# Patient Record
Sex: Male | Born: 1935
Health system: Southern US, Community
[De-identification: ages and names within clinical notes are randomized; demographics above are authoritative.]

## PROBLEM LIST (undated history)

## (undated) HISTORY — PX: TONSILLECTOMY: SUR1361

---

## 2014-09-15 HISTORY — PX: CATARACT EXTRACTION, BILATERAL: SHX1313

## 2016-04-08 DIAGNOSIS — M9903 Segmental and somatic dysfunction of lumbar region: Secondary | ICD-10-CM | POA: Diagnosis not present

## 2016-04-08 DIAGNOSIS — M4726 Other spondylosis with radiculopathy, lumbar region: Secondary | ICD-10-CM | POA: Diagnosis not present

## 2016-04-09 DIAGNOSIS — M9903 Segmental and somatic dysfunction of lumbar region: Secondary | ICD-10-CM | POA: Diagnosis not present

## 2016-04-09 DIAGNOSIS — M4726 Other spondylosis with radiculopathy, lumbar region: Secondary | ICD-10-CM | POA: Diagnosis not present

## 2016-04-14 DIAGNOSIS — M4726 Other spondylosis with radiculopathy, lumbar region: Secondary | ICD-10-CM | POA: Diagnosis not present

## 2016-04-14 DIAGNOSIS — M9903 Segmental and somatic dysfunction of lumbar region: Secondary | ICD-10-CM | POA: Diagnosis not present

## 2016-04-15 DIAGNOSIS — M4726 Other spondylosis with radiculopathy, lumbar region: Secondary | ICD-10-CM | POA: Diagnosis not present

## 2016-04-15 DIAGNOSIS — M9903 Segmental and somatic dysfunction of lumbar region: Secondary | ICD-10-CM | POA: Diagnosis not present

## 2016-04-15 DIAGNOSIS — H1013 Acute atopic conjunctivitis, bilateral: Secondary | ICD-10-CM | POA: Diagnosis not present

## 2016-04-16 DIAGNOSIS — M9903 Segmental and somatic dysfunction of lumbar region: Secondary | ICD-10-CM | POA: Diagnosis not present

## 2016-04-16 DIAGNOSIS — M4726 Other spondylosis with radiculopathy, lumbar region: Secondary | ICD-10-CM | POA: Diagnosis not present

## 2016-04-17 DIAGNOSIS — M4726 Other spondylosis with radiculopathy, lumbar region: Secondary | ICD-10-CM | POA: Diagnosis not present

## 2016-04-17 DIAGNOSIS — M9903 Segmental and somatic dysfunction of lumbar region: Secondary | ICD-10-CM | POA: Diagnosis not present

## 2016-04-21 DIAGNOSIS — M4726 Other spondylosis with radiculopathy, lumbar region: Secondary | ICD-10-CM | POA: Diagnosis not present

## 2016-04-21 DIAGNOSIS — M9903 Segmental and somatic dysfunction of lumbar region: Secondary | ICD-10-CM | POA: Diagnosis not present

## 2016-04-22 DIAGNOSIS — M9903 Segmental and somatic dysfunction of lumbar region: Secondary | ICD-10-CM | POA: Diagnosis not present

## 2016-04-22 DIAGNOSIS — M4726 Other spondylosis with radiculopathy, lumbar region: Secondary | ICD-10-CM | POA: Diagnosis not present

## 2016-04-23 DIAGNOSIS — M9903 Segmental and somatic dysfunction of lumbar region: Secondary | ICD-10-CM | POA: Diagnosis not present

## 2016-04-23 DIAGNOSIS — M4726 Other spondylosis with radiculopathy, lumbar region: Secondary | ICD-10-CM | POA: Diagnosis not present

## 2016-04-24 DIAGNOSIS — M9903 Segmental and somatic dysfunction of lumbar region: Secondary | ICD-10-CM | POA: Diagnosis not present

## 2016-04-24 DIAGNOSIS — M4726 Other spondylosis with radiculopathy, lumbar region: Secondary | ICD-10-CM | POA: Diagnosis not present

## 2016-04-28 DIAGNOSIS — M9903 Segmental and somatic dysfunction of lumbar region: Secondary | ICD-10-CM | POA: Diagnosis not present

## 2016-04-28 DIAGNOSIS — M4726 Other spondylosis with radiculopathy, lumbar region: Secondary | ICD-10-CM | POA: Diagnosis not present

## 2016-04-30 DIAGNOSIS — M4726 Other spondylosis with radiculopathy, lumbar region: Secondary | ICD-10-CM | POA: Diagnosis not present

## 2016-04-30 DIAGNOSIS — M9903 Segmental and somatic dysfunction of lumbar region: Secondary | ICD-10-CM | POA: Diagnosis not present

## 2016-05-01 DIAGNOSIS — M9903 Segmental and somatic dysfunction of lumbar region: Secondary | ICD-10-CM | POA: Diagnosis not present

## 2016-05-01 DIAGNOSIS — M4726 Other spondylosis with radiculopathy, lumbar region: Secondary | ICD-10-CM | POA: Diagnosis not present

## 2016-05-05 DIAGNOSIS — M9903 Segmental and somatic dysfunction of lumbar region: Secondary | ICD-10-CM | POA: Diagnosis not present

## 2016-05-05 DIAGNOSIS — M4726 Other spondylosis with radiculopathy, lumbar region: Secondary | ICD-10-CM | POA: Diagnosis not present

## 2016-05-06 DIAGNOSIS — M4726 Other spondylosis with radiculopathy, lumbar region: Secondary | ICD-10-CM | POA: Diagnosis not present

## 2016-05-06 DIAGNOSIS — M9903 Segmental and somatic dysfunction of lumbar region: Secondary | ICD-10-CM | POA: Diagnosis not present

## 2016-05-13 DIAGNOSIS — M4726 Other spondylosis with radiculopathy, lumbar region: Secondary | ICD-10-CM | POA: Diagnosis not present

## 2016-05-13 DIAGNOSIS — M9903 Segmental and somatic dysfunction of lumbar region: Secondary | ICD-10-CM | POA: Diagnosis not present

## 2016-06-09 DIAGNOSIS — Z23 Encounter for immunization: Secondary | ICD-10-CM | POA: Diagnosis not present

## 2016-09-17 DIAGNOSIS — H43391 Other vitreous opacities, right eye: Secondary | ICD-10-CM | POA: Diagnosis not present

## 2016-09-24 DIAGNOSIS — H43391 Other vitreous opacities, right eye: Secondary | ICD-10-CM | POA: Diagnosis not present

## 2016-10-23 DIAGNOSIS — H43391 Other vitreous opacities, right eye: Secondary | ICD-10-CM | POA: Diagnosis not present

## 2017-04-30 DIAGNOSIS — H1013 Acute atopic conjunctivitis, bilateral: Secondary | ICD-10-CM | POA: Diagnosis not present

## 2017-05-20 ENCOUNTER — Other Ambulatory Visit: Payer: Self-pay | Admitting: Family Medicine

## 2017-05-20 DIAGNOSIS — R42 Dizziness and giddiness: Secondary | ICD-10-CM

## 2017-05-20 DIAGNOSIS — G501 Atypical facial pain: Secondary | ICD-10-CM | POA: Diagnosis not present

## 2017-05-20 DIAGNOSIS — R51 Headache: Principal | ICD-10-CM

## 2017-05-20 DIAGNOSIS — R519 Headache, unspecified: Secondary | ICD-10-CM

## 2017-05-29 ENCOUNTER — Other Ambulatory Visit: Payer: Self-pay

## 2017-06-04 ENCOUNTER — Ambulatory Visit
Admission: RE | Admit: 2017-06-04 | Discharge: 2017-06-04 | Disposition: A | Discharge status: Home / Self Care | Payer: Medicare Other | Source: Ambulatory Visit | Attending: Family Medicine | Admitting: Family Medicine

## 2017-06-04 DIAGNOSIS — R51 Headache: Principal | ICD-10-CM

## 2017-06-04 DIAGNOSIS — R42 Dizziness and giddiness: Secondary | ICD-10-CM

## 2017-06-04 DIAGNOSIS — R519 Headache, unspecified: Secondary | ICD-10-CM

## 2017-06-20 ENCOUNTER — Ambulatory Visit (INDEPENDENT_AMBULATORY_CARE_PROVIDER_SITE_OTHER): Payer: Medicare Other | Admitting: Physician Assistant

## 2017-06-20 ENCOUNTER — Encounter: Payer: Self-pay | Admitting: Physician Assistant

## 2017-06-20 VITALS — BP 102/68 | HR 113 | Temp 97.6°F | Resp 16 | Ht 73.23 in | Wt 174.0 lb

## 2017-06-20 DIAGNOSIS — Z23 Encounter for immunization: Secondary | ICD-10-CM | POA: Diagnosis not present

## 2017-06-20 DIAGNOSIS — G4489 Other headache syndrome: Secondary | ICD-10-CM | POA: Diagnosis not present

## 2017-06-20 MED ORDER — FLUTICASONE PROPIONATE 50 MCG/ACT NA SUSP: 2.0000 | Freq: Every day | NASAL | 12 refills | Status: DC

## 2017-06-20 NOTE — Progress Notes (Signed)
Subjective:    Patient ID: Ryan Reynolds, male    DOB: 09-08-1936, 81 y.o.   MRN: [id number]  HPI Patient presents today as a new patient to establish care. His original PCP was Dr. Leilani Able at [facility] and last saw her on 05/20/17. A brain MRI was ordered per Dr. Pecola Leisure due to his severe LEFT sided headaches. He tried to contact the office multiple times for results of the scan that was done 06/04/2017 and was not able to get a hold of them until this past Tuesday. He was told the scan was normal. He felt that he was not getting the care he wanted so he would like to switch primary care provider.   Patient reports he gets severe 10/10 sharp headaches lasting about 1 hour that occurs in the evenings. They start as a "burning sensation in the left lateral nostril that travels up to the LEFT temple". He denies sensitivity to light or sound with the headaches. The headache causes him loss of appetite and notes he has lost about 9lb since his last weigh in at Dr. Haskel Schroeder office last month. He cannot blow nose when he has a headache due to severity of pain.  The day after the headaches, he feels "washed out and exhausted." The periorbital area of the LEFT eye is sensitive after the headaches. He last saw his optometrist 04/2017 and there were no changes.  He notes they started about 9 weeks ago. He is unable to identify any triggers for the headaches but reports he had two crowns implanted of front teeth around the same time of the onset of headaches. He was prescribed Tramadol #30 per Dr. Pecola Leisure, he took 15 tablets but discontinued as he became constipated and did not notice any difference in pain relief between it and Aleve. He will take 1 tablet of Aleve when the headaches start and helps resolve it "as far as he can tell".   Patient has not had the headaches in the last 3 nights, he is unsure why and has not done anything different.   Reports BM every other day, on Miralax. Reports  constipated after starting Tramadol, was regular before.  Review of Systems  Constitutional: Positive for appetite change, fatigue and unexpected weight change (9lb loss in 1 month). Negative for chills, diaphoresis and fever.  HENT: Positive for congestion (LEFT), postnasal drip (LEFT), rhinorrhea, sinus pain and sinus pressure. Negative for ear pain, sneezing, sore throat, tinnitus and trouble swallowing.   Eyes: Negative for visual disturbance.  Respiratory: Positive for cough. Negative for shortness of breath and wheezing.   Cardiovascular: Negative for chest pain and palpitations.  Gastrointestinal: Negative for abdominal pain, blood in stool, diarrhea, nausea and vomiting.  Genitourinary: Negative for difficulty urinating.  Neurological: Positive for headaches. Negative for dizziness, tremors, syncope, speech difficulty and weakness.  Psychiatric/Behavioral: Negative for dysphoric mood. The patient is not nervous/anxious.    There are no active problems to display for this patient.  Prior to Admission medications   Not on File   No Known Allergies Social History   Social History  . Marital status: Married    Spouse name: Harriett Sine   . Number of children: 1  . Years of education: 2.5 year undergrad   Occupational History  . 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 Smoking status: Current Every Day Smoker    Packs/day: 1.00  Types: Cigarettes  . Smokeless tobacco: Never Used  . Alcohol use No  . Drug use: No  . Sexual activity: Not on file   Other Topics Concern  . Not on file   Social History Narrative   Lives at home with wife. No steps to front porch of 1 story home. 1 step to back porch.       Objective:   Physical Exam  Constitutional: He is oriented to person, place, and time. He appears well-developed and well-nourished. No distress.  HENT:  Head: Normocephalic and atraumatic.  Right Ear: External ear  normal.  Left Ear: External ear normal.  Nose: Nose normal.  Mouth/Throat: Oropharynx is clear and moist. No oropharyngeal exudate.  No tenderness to palpation over the temporal arteries  Eyes: Pupils are equal, round, and reactive to light. Conjunctivae are normal. Right eye exhibits no discharge. Left eye exhibits no discharge. No scleral icterus.  Neck: Neck supple. No JVD present. Carotid bruit is not present. No tracheal deviation present. No thyromegaly present.  Cardiovascular: Normal rate, regular rhythm and intact distal pulses.   Murmur (2/6 systolic murmur heard over the aorta) heard. Pulmonary/Chest: Effort normal and breath sounds normal. No stridor. No respiratory distress. He has no wheezes. He has no rales. He exhibits no tenderness.  Lymphadenopathy:    He has no cervical adenopathy.  Neurological: He is alert and oriented to person, place, and time.  Skin: Skin is warm and dry. No rash noted. He is not diaphoretic. No erythema. No pallor.  Psychiatric: He has a normal mood and affect. His behavior is normal. Judgment and thought content normal.      Assessment & Plan:  1. Other headache syndrome - Try IN steroid spray to see if it helps with headaches.  - fluticasone (FLONASE) 50 MCG/ACT nasal spray; Place 2 sprays into both nostrils daily.  Dispense: 16 g; Refill: 12  2. Need for prophylactic vaccination and inoculation against influenza - Complete in office today.  - Flu Vaccine QUAD 36+ mos IM  Follow up in 2 weeks to re-evaluate headaches and efficacy of Flonase for symptoms, sooner if needed.  Respectfully, Gala Romney PA-S 2019

## 2017-06-20 NOTE — Patient Instructions (Signed)
     IF you received an x-ray today, you will receive an invoice from [facility]. Please contact [facility] at [phone] with questions or concerns regarding your invoice.   IF you received labwork today, you will receive an invoice from [facility]. Please contact [facility] at [phone] with questions or concerns regarding your invoice.   Our billing staff will not be able to assist you with questions regarding bills from these companies.  You will be contacted with the lab results as soon as they are available. The fastest way to get your results is to activate your My Chart account. Instructions are located on the last page of this paperwork. If you have not heard from us regarding the results in 2 weeks, please contact this office.     

## 2017-06-23 ENCOUNTER — Telehealth: Payer: Self-pay

## 2017-06-25 NOTE — Progress Notes (Signed)
Patient ID: Ryan Reynolds, male     DOB: [birth year], 81 y.o.    MRN: [id number]  PCP: Leilani Able, MD  Chief Complaint  Patient presents with  . Establish Care    pt main concern is headaches that started 9 weeks ago. Pt states they happen at night snd more on the left side. Pt states he feels that are affecting his appite.     Subjective:   This patient is new to this practice and presents to establish care and for evaluation of headaches. He is accompanied by his wife. A friend at church, Allayne Butcher, who is also my patient, recommended me to him.  He was last seen by his PCP on 9/05 for these headaches, which began about 9 weeks ago. No identified triggers, though he had some dental work around the time the headaches began. The headaches typically occur in the evenings, are 10/10 and the pain is sharp. They begin with a burning sensation in the LEFT nostril that radiates up into the LEFT temple, and last about an hour. The pain is too severe to blow his nose when he has the headache. There is no associated photo- or phonophobia, but he does experience anorexia and reports an estimated 9 lb weight loss since his visit on 05/20/2017.  He was prescribed tramadol and a brain MRI was performed on 06/04/2017. It took until 10/02 to get the results, reportedly normal, which is why he is looking to establish care with me.  The tramadol causes constipation and was not more effective than naproxen, so he stopped it. Naproxen helps some if he takes it as soon as the headache starts.  The day following one of these headaches, he is very tired, "washed out and exhausted."  His last eye exam was 04/2017 and was reportedly normal.  Review of Systems Constitutional: Positive for appetite change, fatigue and unexpected weight change (9lb loss in 1 month). Negative for chills, diaphoresis and fever.  HENT: Positive for congestion (LEFT), postnasal drip (LEFT), rhinorrhea, sinus pain and sinus pressure.  Negative for ear pain, sneezing, sore throat, tinnitus and trouble swallowing.   Eyes: Negative for visual disturbance.  Respiratory: Positive for cough. Negative for shortness of breath and wheezing.   Cardiovascular: Negative for chest pain and palpitations.  Gastrointestinal: Negative for abdominal pain, blood in stool, diarrhea, nausea and vomiting.  Genitourinary: Negative for difficulty urinating.  Neurological: Positive for headaches. Negative for dizziness, tremors, syncope, speech difficulty and weakness.  Psychiatric/Behavioral: Negative for dysphoric mood. The patient is not nervous/anxious.     Prior to Admission medications   None on file   No Known Allergies   Patient Active Problem List   Diagnosis Date Noted  . Other headache syndrome 06/20/2017     Family History  Problem Relation Age of Onset  . Stroke Mother   . Stroke Father      Social History   Social History  . Marital status: Married    Spouse name: Harriett Sine   . Number of children: 1  . Years of education: 2.5 year undergrad   Occupational History  . 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 Smoking status: Current Every Day Smoker    Packs/day: 1.00    Types: Cigarettes  . Smokeless tobacco: Never Used  . Alcohol use No  . Drug use: No  . Sexual activity: Not on file  Other Topics Concern  . Not on file   Social History Narrative   Lives at home with wife. No steps to front porch of 1 story home. 1 step to back porch.          Objective:  Physical Exam  Constitutional: He is oriented to person, place, and time. He appears well-developed and well-nourished. He is active and cooperative. No distress.  BP 102/68   Pulse (!) 113   Temp 97.6 F (36.4 C) (Oral)   Resp 16   Ht 6' 1.23" (1.86 m)   Wt 174 lb (78.9 kg)   SpO2 95%   BMI 22.81 kg/m   HENT:  Head: Normocephalic and atraumatic.  Right Ear: Hearing normal.  Left Ear:  Hearing normal.  Nose: Mucosal edema present. No rhinorrhea, nose lacerations, sinus tenderness, nasal deformity, septal deviation or nasal septal hematoma. No epistaxis.  No foreign bodies. Right sinus exhibits no maxillary sinus tenderness and no frontal sinus tenderness. Left sinus exhibits no maxillary sinus tenderness and no frontal sinus tenderness.  No temporal tenderness  Eyes: Conjunctivae are normal. No scleral icterus.  Neck: Normal range of motion. Neck supple. No thyromegaly present.  Cardiovascular: Normal rate and regular rhythm.   Murmur (in the aortic space) heard.  Systolic murmur is present with a grade of 2/6  Pulses:      Radial pulses are 2+ on the right side, and 2+ on the left side.  Pulmonary/Chest: Effort normal and breath sounds normal.  Lymphadenopathy:       Head (right side): No tonsillar, no preauricular, no posterior auricular and no occipital adenopathy present.       Head (left side): No tonsillar, no preauricular, no posterior auricular and no occipital adenopathy present.    He has no cervical adenopathy.       Right: No supraclavicular adenopathy present.       Left: No supraclavicular adenopathy present.  Neurological: He is alert and oriented to person, place, and time. He has normal strength. No cranial nerve deficit or sensory deficit. GCS eye subscore is 4. GCS verbal subscore is 5. GCS motor subscore is 6.  Reflex Scores:      Bicep reflexes are 2+ on the right side and 2+ on the left side.      Patellar reflexes are 2+ on the right side and 2+ on the left side. Skin: Skin is warm, dry and intact. No rash noted. No cyanosis or erythema. Nails show no clubbing.  Psychiatric: He has a normal mood and affect. His speech is normal and behavior is normal.   Mr Brain Wo Contrast  Result Date: 06/04/2017 CLINICAL DATA:  Left-sided headache at night EXAM: MRI HEAD WITHOUT CONTRAST TECHNIQUE: Multiplanar, multiecho pulse sequences of the brain and  surrounding structures were obtained without intravenous contrast. COMPARISON:  None. FINDINGS: Brain: Diffusion imaging does not show any acute or subacute infarction. There is mild age related volume loss. There are a few small foci of T2 and FLAIR signal in the hemispheric white matter consistent with minimal small vessel change, less than often seen in healthy individuals of this age. No cortical or large vessel territory infarction. No evidence of mass lesion, hemorrhage, hydrocephalus or extra-axial collection. No pituitary mass. Vascular: Major vessels at the base of the brain show flow. Skull and upper cervical spine: Negative Sinuses/Orbits: Clear/normal Other: None significant IMPRESSION: No cause of headache identified. Essentially normal study for age. Mild age related volume loss and minimal small vessel  change of the hemispheric white matter. Electronically Signed   By: Paulina Fusi M.D.   On: 06/04/2017 10:23           Assessment & Plan:   Problem List Items Addressed This Visit    Other headache syndrome - Primary    Unclear etiology. MRI brain reassuring. Consider LEFT sinus as source. Trial of steroid nasal spray.      Relevant Medications   fluticasone (FLONASE) 50 MCG/ACT nasal spray    Other Visit Diagnoses    Need for prophylactic vaccination and inoculation against influenza       Relevant Orders   Flu Vaccine QUAD 36+ mos IM (Completed)       Return in about 2 weeks (around 07/04/2017) for re-evaluation of headache, sinuses.   Fernande Bras, PA-C [facility] at [facility]

## 2017-06-25 NOTE — Assessment & Plan Note (Signed)
Unclear etiology. MRI brain reassuring. Consider LEFT sinus as source. Trial of steroid nasal spray.

## 2017-07-10 ENCOUNTER — Encounter: Payer: Self-pay | Admitting: Physician Assistant

## 2017-07-10 ENCOUNTER — Ambulatory Visit (INDEPENDENT_AMBULATORY_CARE_PROVIDER_SITE_OTHER): Payer: Medicare Other | Admitting: Physician Assistant

## 2017-07-10 VITALS — BP 120/76 | HR 105 | Temp 97.6°F | Resp 18 | Ht 73.23 in | Wt 176.6 lb

## 2017-07-10 DIAGNOSIS — F17200 Nicotine dependence, unspecified, uncomplicated: Secondary | ICD-10-CM | POA: Diagnosis not present

## 2017-07-10 DIAGNOSIS — Z23 Encounter for immunization: Secondary | ICD-10-CM | POA: Diagnosis not present

## 2017-07-10 DIAGNOSIS — G4489 Other headache syndrome: Secondary | ICD-10-CM | POA: Diagnosis not present

## 2017-07-10 DIAGNOSIS — H9192 Unspecified hearing loss, left ear: Secondary | ICD-10-CM | POA: Insufficient documentation

## 2017-07-10 NOTE — Progress Notes (Signed)
Subjective:    Patient ID: Ryan Reynolds, male    DOB: 20-Jan-1936, 81 y.o.   MRN: [id number]  HPI  Mr. Mawson is a 81 year old Caucasian male with a past medical history significant for hearing loss in left ear and smoking who presents today for follow-up of headaches. Mr. Blankenbaker was last seen in clinic 06/20/17 with a chief complaint of severe left sided headaches. He was previously seen by Dr. Leilani Able at [facility] on 05/20/17 for nightly headaches that were causing him 10/10 pain. He reports it was a fierce, burning pain on the left side of his nose that would last 20 minutes - 2 hours. He was treated with Tramadol and got a brain MRI which was normal. Mr. Buckle reports the Tramadol caused constipation so he stopped taking the pills. His headaches were improving about 3-4 days prior to his visit on 06/20/17. At his last visit on 06/20/17 he was started on Flonase.    Mr. Weida reports the Aleda Grana has really helped. He believes there is a direct correlation with how much mucous comes out and an improvement in his headaches. He states he has only had 1 headache over the last 20 days that was a 4-5/10. He describes having minor discomfort on 10 of the last 20 days that he rates as a 1-2/10 on the pain scale. The other days he has had even less discomfort. Mr. Proudfoot reports he had to take Aleve 7-8 times since his last visit. Mr. Cammack endorses some nasal congestion and post-nasal drip. He reports he has also had a mild cough due to drainage. Mr. Tsuda denies fevers. He also denies vision changes and aura, but reports the other day he had some mild blurred vision in his upper left eye that seemed wavy and lasted about 5 minutes. He denies photophobia or phonophobia. He also denies eye tearing, ear pain or ear drainage. Mr. Perreira also denies sore throat, dyspnea, unsteadiness or dizziness.   Medications:  Prior to Admission medications   Medication Sig Start Date End Date Taking?  Authorizing Provider  fluticasone (FLONASE) 50 MCG/ACT nasal spray Place 2 sprays into both nostrils daily. 06/20/17  Yes Jeffery, Chelle, PA-C   Allergies: No Known Allergies  Chronic Medical Conditions:  Patient Active Problem List   Diagnosis Date Noted  . Hearing loss in left ear 07/10/2017  . Smoker 07/10/2017  . Other headache syndrome 06/20/2017   Review of Systems     Objective:   Physical Exam  Constitutional: He appears well-developed and well-nourished. He is cooperative.  BP 120/76 (BP Location: Right Arm, Patient Position: Sitting, Cuff Size: Normal)   Pulse (!) 105   Temp 97.6 F (36.4 C)   Resp 18   Ht 6' 1.23" (1.86 m)   Wt 176 lb 9.6 oz (80.1 kg)   SpO2 97%   BMI 23.15 kg/m    HENT:  Head: Normocephalic and atraumatic.  Right Ear: Hearing, external ear and ear canal normal. Tympanic membrane is not erythematous.  Left Ear: Hearing, tympanic membrane, external ear and ear canal normal. Tympanic membrane is not erythematous.  Nose: Nose normal. Right sinus exhibits no maxillary sinus tenderness and no frontal sinus tenderness. Left sinus exhibits no maxillary sinus tenderness and no frontal sinus tenderness.  Mouth/Throat: Oropharynx is clear and moist and mucous membranes are normal. No oral lesions.  Eyes: Pupils are equal, round, and reactive to light. Conjunctivae and EOM are normal.  Fundoscopic exam:  The right eye shows no AV nicking, no exudate and no papilledema.       The left eye shows no AV nicking, no exudate and no papilledema.  Neck: Normal range of motion and full passive range of motion without pain. Neck supple. No thyromegaly present.  Cardiovascular: Normal rate, regular rhythm and normal heart sounds.   No murmur heard. Pulses:      Radial pulses are 2+ on the right side, and 2+ on the left side.  Pulmonary/Chest: Effort normal and breath sounds normal.  Lymphadenopathy:    He has no cervical adenopathy.  Neurological: He is  alert. No cranial nerve deficit.  Skin: Skin is warm and dry.      Assessment & Plan:  1. Other headache syndrome - Continue Flonase 5850mcg/act 2 sprays in each nostril daily - Instructed patient to return to clinic if symptoms do not improve or worsen in the next 2-4 weeks and we will discuss referral to neurology.  2. Need for pneumococcal vaccination - Pneumococcal polysaccharide vaccine 23-valent greater than or equal to 2yo subcutaneous/IM administered today in clinic  Emanuella Nickle, PA-S

## 2017-07-10 NOTE — Progress Notes (Signed)
Patient ID: Ryan Reynolds, male    DOB: [birth year], 81 y.o.   MRN: [id number]  PCP: Ryan Mons, PA-C  Chief Complaint  Patient presents with  . Headache    Pt states headaches have been better and has only a had couple of minor headaches.  . Sinus Problem  . Follow-up    Subjective:   Presents for evaluation of headache.  I met him on 06/20/2017, accompanied by his wife, to establish care and for evaluation of 9 weeks of headache. His previous provider had ordered an MRI, which revealed no cause, and tramadol was prescribed. The tramadol caused constipation and was no more effective than naproxen, so he stopped it. The naproxen was minimally effective.  Given the associated symptoms, subacute sinusitis/allergic rhinitis was considered a potential cause of his headaches, and he was prescribed a steroid nasal spray.  Records from his previous provider were requested and received. The only vaccination documented in influenza in 2012. No other health maintenance items are included.  Today he reports that his headaches have been less frequent, having only a couple of "minor" headaches since our previous visit. He finds that there is a direct correlation between eliminating the mucous and headache reduction.    Review of Systems No CP, SOB, HA, dizziness. No fever, chills.    Patient Active Problem List   Diagnosis Date Noted  . Hearing loss in left ear 07/10/2017  . Smoker 07/10/2017  . Other headache syndrome 06/20/2017     Prior to Admission medications   Medication Sig Start Date End Date Taking? Authorizing Provider  fluticasone (FLONASE) 50 MCG/ACT nasal spray Place 2 sprays into both nostrils daily. 06/20/17  Yes Floyd Wade, PA-C     No Known Allergies     Objective:  Physical Exam  Constitutional: He is oriented to person, place, and time. He appears well-developed and well-nourished. He is active and cooperative. No distress.  BP 120/76 (BP  Location: Right Arm, Patient Position: Sitting, Cuff Size: Normal)   Pulse (!) 105   Temp 97.6 F (36.4 C)   Resp 18   Ht 6' 1.23" (1.86 m)   Wt 176 lb 9.6 oz (80.1 kg)   SpO2 97%   BMI 23.15 kg/m   HENT:  Head: Normocephalic and atraumatic.  Right Ear: Hearing normal.  Left Ear: Hearing normal.  Eyes: Conjunctivae are normal. No scleral icterus.  Neck: Normal range of motion. Neck supple. No thyromegaly present.  Cardiovascular: Normal rate, regular rhythm and normal heart sounds.  Pulses:      Radial pulses are 2+ on the right side, and 2+ on the left side.  Pulmonary/Chest: Effort normal and breath sounds normal.  Lymphadenopathy:       Head (right side): No tonsillar, no preauricular, no posterior auricular and no occipital adenopathy present.       Head (left side): No tonsillar, no preauricular, no posterior auricular and no occipital adenopathy present.    He has no cervical adenopathy.       Right: No supraclavicular adenopathy present.       Left: No supraclavicular adenopathy present.  Neurological: He is alert and oriented to person, place, and time. No sensory deficit.  Skin: Skin is warm, dry and intact. No rash noted. No cyanosis or erythema. Nails show no clubbing.  Psychiatric: He has a normal mood and affect. His speech is normal and behavior is normal.           Assessment &  Plan:   Problem List Items Addressed This Visit    Other headache syndrome - Primary    Apparently related to sinus congestion. Continue steroid nasal spray and hydration. If symptoms worsen, consider evaluation with ENT vs neurology. Smoking cessation encouraged.      Smoker    Cessation encouraged.       Other Visit Diagnoses    Need for pneumococcal vaccination       Relevant Orders   Pneumococcal polysaccharide vaccine 23-valent greater than or equal to 2yo subcutaneous/IM (Completed)       Return in about 6 months (around 01/08/2018) for re-evaluation and Pneumovax  23.   Fara Chute, PA-C [facility] at [place]

## 2017-07-10 NOTE — Patient Instructions (Addendum)
Continue the nasal spray. If you're still having head discomfort or pain in another 2-4 weeks, please let me know so that I can refer you to either ENT or neurology.    IF you received an x-ray today, you will receive an invoice from [facility]. Please contact [facility] at [phone] with questions or concerns regarding your invoice.   IF you received labwork today, you will receive an invoice from [facility]. Please contact [facility] at [phone] with questions or concerns regarding your invoice.   Our billing staff will not be able to assist you with questions regarding bills from these companies.  You will be contacted with the lab results as soon as they are available. The fastest way to get your results is to activate your My Chart account. Instructions are located on the last page of this paperwork. If you have not heard from us regarding the results in 2 weeks, please contact this office.    Did you know that you begin to benefit from quitting smoking within the first twenty minutes? It's TRUE.  At 20 minutes: -blood pressure decreases -pulse rate drops -body temperature of hands and feet increases  At 8 hours: -carbon monoxide level in blood drops to normal -oxygen level in blood increases to normal  At 24 hours: -the chance of heart attack decreases  At 48 hours: -nerve endings start regrowing -ability to smell and taste is enhanced  2 weeks-3 months: -circulation improves -walking becomes easier -lung function improves  1-9 months: -coughing, sinus congestion, fatigue and shortness of breath decreases  1 year: -excess risk of heart disease is decreased to HALF that of a smoker  5 years: Stroke risk is reduced to that of people who have never smoked  10 years: -risk of lung cancer drops to as little as half that of continuing smokers -risk of cancer of the mouth, throat, esophagus, bladder, kidney and pancreas decreases -risk of ulcer  decreases  15 years -risk of heart disease is now similar to that of people who have never smoked -risk of death returns to nearly the level of people who have never smoked

## 2017-07-19 NOTE — Assessment & Plan Note (Signed)
Cessation encouraged 

## 2017-07-19 NOTE — Assessment & Plan Note (Signed)
Apparently related to sinus congestion. Continue steroid nasal spray and hydration. If symptoms worsen, consider evaluation with ENT vs neurology. Smoking cessation encouraged.

## 2017-08-31 ENCOUNTER — Telehealth: Payer: Self-pay | Admitting: Physician Assistant

## 2017-08-31 ENCOUNTER — Telehealth: Payer: Self-pay

## 2017-08-31 NOTE — Telephone Encounter (Unsigned)
Copied from CRM #[id number]. Topic: Inquiry >> Aug 31, 2017  8:44 AM White, Selina wrote: Reason for CRM: Patient called to see if he still need to be using the fluticasone (FLONASE) 50 MCG/ACT nasal spray, the Rx was wrote for 12 refill. Patient stated that he has refilled the Rx twice and feeling pretty good. Patient is requesting a call back from Chelle Jeffrey assistant. 

## 2017-08-31 NOTE — Telephone Encounter (Signed)
Copied from CRM [id number]. Topic: Inquiry >> Aug 31, 2017  8:44 AM Viviann SpareWhite, Selina wrote: Reason for CRM: Patient called to see if he still need to be using the fluticasone (FLONASE) 50 MCG/ACT nasal spray, the Rx was wrote for 12 refill. Patient stated that he has refilled the Rx twice and feeling pretty good. Patient [facility].

## 2017-08-31 NOTE — Telephone Encounter (Addendum)
I suspect that he has chronic sinus problems, and so I would recommend he continue it, especially as he continues to smoke. If he chooses not to, that is also fine. He can stop it at his leisure.

## 2017-09-01 NOTE — Telephone Encounter (Signed)
Patient advised.

## 2017-09-02 NOTE — Telephone Encounter (Signed)
Pt advised.

## 2017-09-10 NOTE — Telephone Encounter (Signed)
Error. Close encounter

## 2017-12-21 ENCOUNTER — Encounter: Payer: Self-pay | Admitting: Physician Assistant

## 2018-01-08 ENCOUNTER — Other Ambulatory Visit: Payer: Self-pay

## 2018-01-08 ENCOUNTER — Ambulatory Visit: Payer: Medicare Other | Admitting: Physician Assistant

## 2018-01-08 ENCOUNTER — Encounter: Payer: Self-pay | Admitting: Physician Assistant

## 2018-01-08 VITALS — BP 120/76 | HR 95 | Temp 97.7°F | Resp 16 | Ht 73.23 in | Wt 181.0 lb

## 2018-01-08 DIAGNOSIS — G4489 Other headache syndrome: Secondary | ICD-10-CM

## 2018-01-08 DIAGNOSIS — F17200 Nicotine dependence, unspecified, uncomplicated: Secondary | ICD-10-CM

## 2018-01-08 DIAGNOSIS — Z23 Encounter for immunization: Secondary | ICD-10-CM

## 2018-01-08 NOTE — Patient Instructions (Addendum)
     IF you received an x-ray today, you will receive an invoice from [facility]. Please contact [facility] at [phone] with questions or concerns regarding your invoice.   IF you received labwork today, you will receive an invoice from [facility]. Please contact [facility] at [phone] with questions or concerns regarding your invoice.   Our billing staff will not be able to assist you with questions regarding bills from these companies.  You will be contacted with the lab results as soon as they are available. The fastest way to get your results is to activate your My Chart account. Instructions are located on the last page of this paperwork. If you have not heard from us regarding the results in 2 weeks, please contact this office.    Did you know that you begin to benefit from quitting smoking within the first twenty minutes? It's TRUE.  At 20 minutes: -blood pressure decreases -pulse rate drops -body temperature of hands and feet increases  At 8 hours: -carbon monoxide level in blood drops to normal -oxygen level in blood increases to normal  At 24 hours: -the chance of heart attack decreases  At 48 hours: -nerve endings start regrowing -ability to smell and taste is enhanced  2 weeks-3 months: -circulation improves -walking becomes easier -lung function improves  1-9 months: -coughing, sinus congestion, fatigue and shortness of breath decreases  1 year: -excess risk of heart disease is decreased to HALF that of a smoker  5 years: Stroke risk is reduced to that of people who have never smoked  10 years: -risk of lung cancer drops to as little as half that of continuing smokers -risk of cancer of the mouth, throat, esophagus, bladder, kidney and pancreas decreases -risk of ulcer decreases  15 years -risk of heart disease is now similar to that of people who have never smoked -risk of death returns to nearly the level of people who have  never smoked   

## 2018-01-08 NOTE — Progress Notes (Signed)
Patient ID: Ryan Reynolds, male    DOB: May 02, 1936, 82 y.o.   MRN: [id number]  PCP: Porfirio OarJeffery, Jenelle Drennon, PA-C  Chief Complaint  Patient presents with  . Headache    follow up     Subjective:   Presents for evaluation of headache.  Reports no significant headaches since beginning regular use of Flonase nasal spray and October. He continues to smoke. His wife is cutting back, hopes to quit, after recent COPD exacerbation.  He relates quitting for 6 months in the past.  "Cold [place]."  He started smoking again after a friend lost his job and offered him a cigarette and condolence support.  He denies cough, shortness of breath, chest pain. Relates that he does enjoy smoking..    Review of Systems As above.    Patient Active Problem List   Diagnosis Date Noted  . Hearing loss in left ear 07/10/2017  . Smoker 07/10/2017  . Other headache syndrome 06/20/2017     Prior to Admission medications   Medication Sig Start Date End Date Taking? Authorizing Provider  fluticasone (FLONASE) 50 MCG/ACT nasal spray Place 2 sprays into both nostrils daily. 06/20/17  Yes Sherran Margolis, PA-C     No Known Allergies     Objective:  Physical Exam  Constitutional: He is oriented to person, place, and time. He appears well-developed and well-nourished. He is active and cooperative. No distress.  BP 120/76   Pulse 95   Temp 97.7 F (36.5 C)   Resp 16   Ht 6' 1.23" (1.86 m)   Wt 181 lb (82.1 kg)   SpO2 95%   BMI 23.73 kg/m   HENT:  Head: Normocephalic and atraumatic.  Right Ear: Hearing normal.  Left Ear: Hearing normal.  Eyes: Conjunctivae are normal. No scleral icterus.  Neck: Normal range of motion. Neck supple. No thyromegaly present.  Cardiovascular: Normal rate, regular rhythm and normal heart sounds.  Pulses:      Radial pulses are 2+ on the right side, and 2+ on the left side.  Pulmonary/Chest: Effort normal and breath sounds normal.  Lymphadenopathy:       Head  (right side): No tonsillar, no preauricular, no posterior auricular and no occipital adenopathy present.       Head (left side): No tonsillar, no preauricular, no posterior auricular and no occipital adenopathy present.    He has no cervical adenopathy.       Right: No supraclavicular adenopathy present.       Left: No supraclavicular adenopathy present.  Neurological: He is alert and oriented to person, place, and time. No sensory deficit.  Skin: Skin is warm, dry and intact. No rash noted. No cyanosis or erythema. Nails show no clubbing.  Psychiatric: He has a normal mood and affect. His speech is normal and behavior is normal.           Assessment & Plan:   Problem List Items Addressed This Visit    Other headache syndrome    Well-controlled with daily use of Flonase nasal spray.  Continue.      Smoker    Enjoys smoking.  Discussed methods to assist with smoking cessation.  Encouraged him to consider these options and to identify the specific behaviors/benefits he enjoys with smoking.       Other Visit Diagnoses    Need for pneumococcal vaccination    -  Primary   Relevant Orders   Pneumococcal conjugate vaccine 13-valent IM (Completed)   Care order/instruction: (  Completed)       Return for wellness evaluation at your convenience.   Fernande Bras, PA-C Primary Care at [facility]

## 2018-01-09 NOTE — Assessment & Plan Note (Signed)
Enjoys smoking.  Discussed methods to assist with smoking cessation.  Encouraged him to consider these options and to identify the specific behaviors/benefits he enjoys with smoking.

## 2018-01-09 NOTE — Assessment & Plan Note (Signed)
Well-controlled with daily use of Flonase nasal spray.  Continue.

## 2018-05-10 ENCOUNTER — Telehealth: Payer: Self-pay | Admitting: Family Medicine

## 2018-05-10 NOTE — Telephone Encounter (Signed)
This is the patient that was charged by his insurance for the 2nd pneumococcal vaccine.  Scott did give me permission to write it off.  Now the patient is inquiring if he truly got a "booster" or not.  He said our computer system was down the day he had the second.  So he doesn't know if he is really protected or not.  He was a Chelle patient.  Please call [phone].

## 2018-05-18 DIAGNOSIS — H1013 Acute atopic conjunctivitis, bilateral: Secondary | ICD-10-CM | POA: Diagnosis not present

## 2018-05-22 NOTE — Telephone Encounter (Signed)
Immunization History  Administered Date(s) Administered  . Influenza, High Dose Seasonal PF 06/09/2016  . Influenza,inj,Quad PF,6+ Mos 06/20/2017  . Pneumococcal Conjugate-13 01/08/2018  . Pneumococcal Polysaccharide-23 07/10/2017   He received his Pneumococcal 23 strain and then his 13 strain so he has both and is fully immunized.

## 2018-05-25 NOTE — Telephone Encounter (Signed)
Called and informed pt that he has received booster Prevnar 13, he verbalized understanding.

## 2018-06-23 ENCOUNTER — Telehealth: Payer: Self-pay | Admitting: General Practice

## 2018-06-23 NOTE — Telephone Encounter (Signed)
Copied from CRM [id number]. Topic: Quick Communication - Rx Refill/Question >> Jun 23, 2018 10:23 AM Crist Infante wrote: Medication:  fluticasone (FLONASE) 50 MCG/ACT nasal spray Pt states Chelle refilled the last time. Pt declined to make an appt at this time.  Wants to find out if he can refill now. [id number] [place], [place] - [address] AT [facility] [address] [phone] (Phone) [phone] (Fax)

## 2018-06-24 ENCOUNTER — Other Ambulatory Visit: Payer: Self-pay

## 2018-06-24 DIAGNOSIS — G4489 Other headache syndrome: Secondary | ICD-10-CM

## 2018-06-24 MED ORDER — FLUTICASONE PROPIONATE 50 MCG/ACT NA SUSP: 2.0000 | Freq: Every day | NASAL | 3 refills | Status: DC

## 2018-06-24 NOTE — Telephone Encounter (Signed)
Refilled x 3 mo with note to establish with another provider - Chelle no longer here.

## 2018-07-13 ENCOUNTER — Ambulatory Visit: Payer: Medicare Other

## 2018-07-23 DIAGNOSIS — Z23 Encounter for immunization: Secondary | ICD-10-CM | POA: Diagnosis not present

## 2018-12-10 IMAGING — MR MR HEAD W/O CM
10 series · 47 of 48 positions shown · non-contrast
Comparison: None.

CLINICAL DATA: Left-sided headache at night

EXAM:
MRI HEAD WITHOUT CONTRAST
TECHNIQUE: Multiplanar, multiecho pulse sequences of the brain and surrounding
structures were obtained without intravenous contrast.

[Series 2: T1 · sagittal · 5.0mm · 0.45mm/px · 2 of 21 slices shown]
[im 1/21]
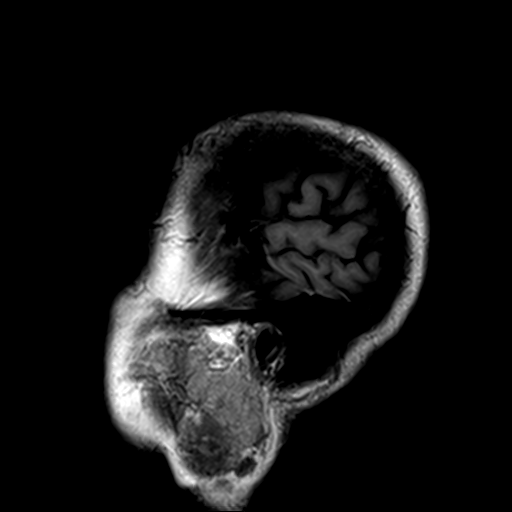
[im 21/21]
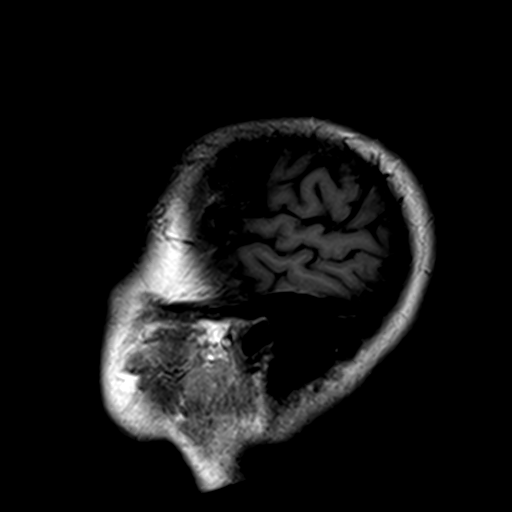

[Series 3: DWI · axial · 3.0mm · 1.80mm/px · z∈[-25,+121]mm · 8 of 99 slices shown (1 of 4)]
[im 1/99]
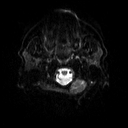
[im 15/99]
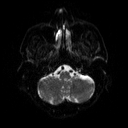
[im 29/99]
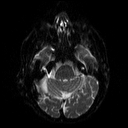
[im 43/99]
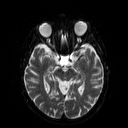
[im 57/99]
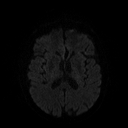
[im 71/99]
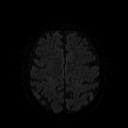
[im 85/99]
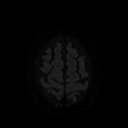
[im 99/99]
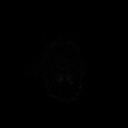

[Series 4: DWI · axial · 3.0mm · 1.80mm/px · z∈[-25,+121]mm · 4 of 50 slices shown (2 of 4)]
[im 1/50]
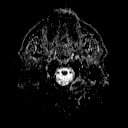
[im 17/50]
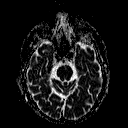
[im 33/50]
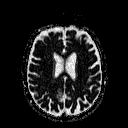
[im 50/50]
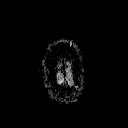

[Series 6: swi_images · axial · 2.0mm · 0.90mm/px · z∈[-29,+127]mm · 7 of 80 slices shown]
[im 1/80]
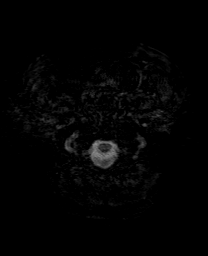
[im 14/80]
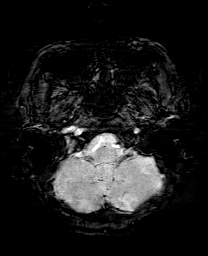
[im 27/80]
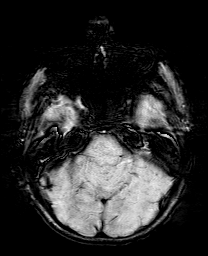
[im 40/80]
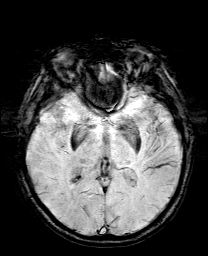
[im 53/80]
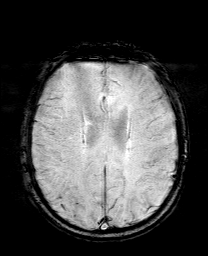
[im 66/80]
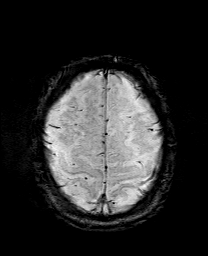
[im 80/80]
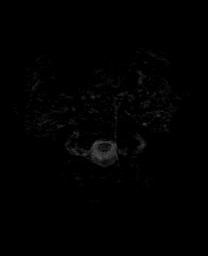

[Series 7: DWI · coronal · 5.0mm · 1.80mm/px · 5 of 67 slices shown (3 of 4)]
[im 1/67]
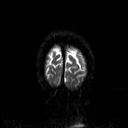
[im 17/67]
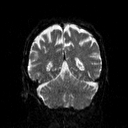
[im 34/67]
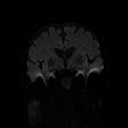
[im 50/67]
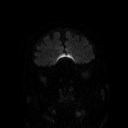
[im 67/67]
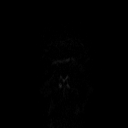

[Series 8: DWI · coronal · 5.0mm · 1.80mm/px · 3 of 34 slices shown (4 of 4)]
[im 1/34]
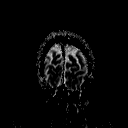
[im 17/34]
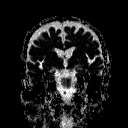
[im 34/34]
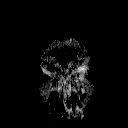

[Series 9: T2 · axial · 5.0mm · 0.51mm/px · z∈[-21,+120]mm · 2 of 22 slices shown (1 of 2)]
[im 1/22]
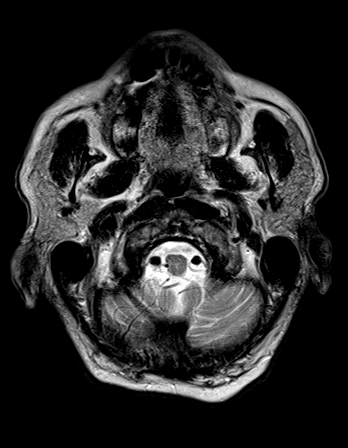
[im 22/22]
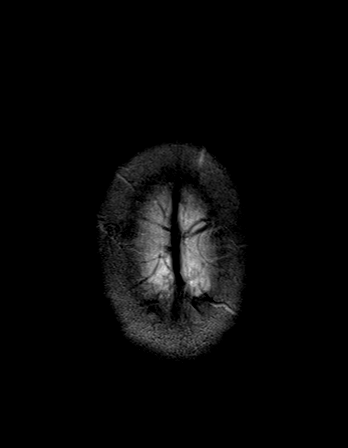

[Series 10: FLAIR · axial · 3.0mm · 0.45mm/px · z∈[-29,+125]mm · 2 of 27 slices shown]
[im 1/27]
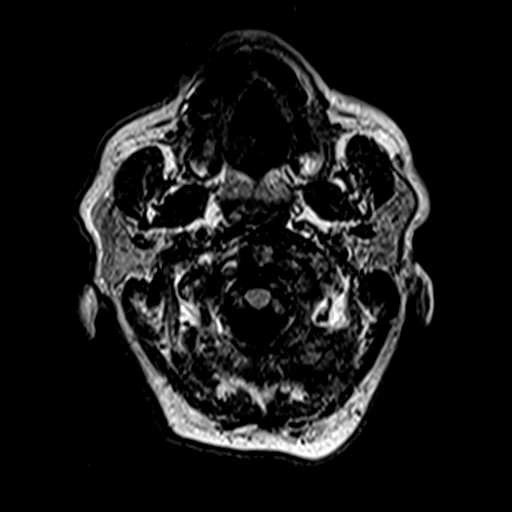
[im 27/27]
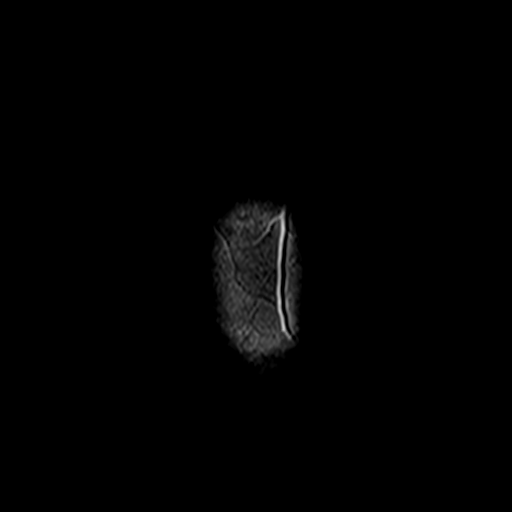

[Series 11: t1_mpr_tra · axial · 1.0mm · 0.45mm/px · z∈[-29,+128]mm · 12 of 160 slices shown]
[im 1/160]
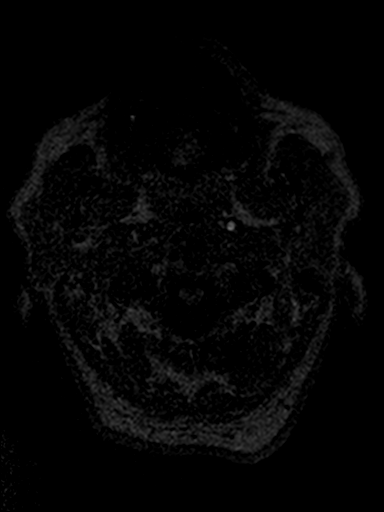
[im 14/160]
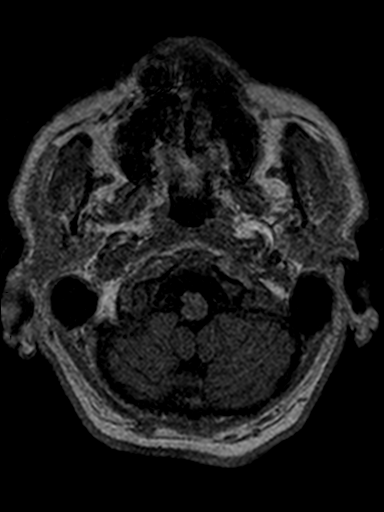
[im 27/160]
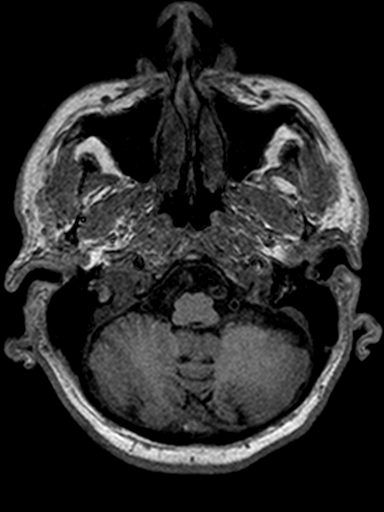
[im 40/160]
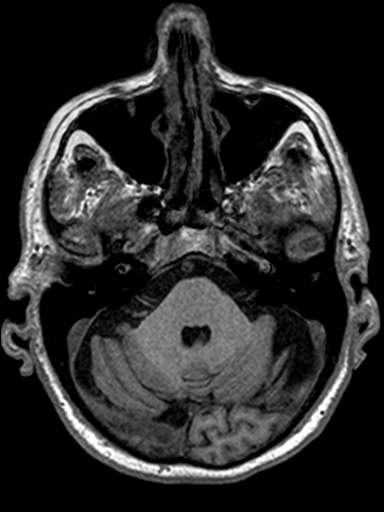
[im 54/160]
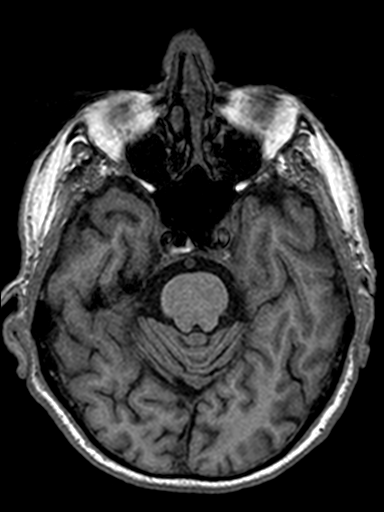
[im 67/160]
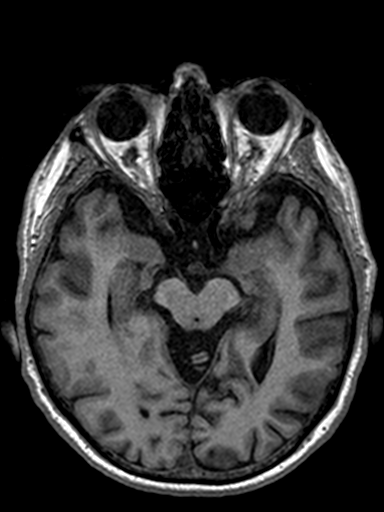
[im 80/160]
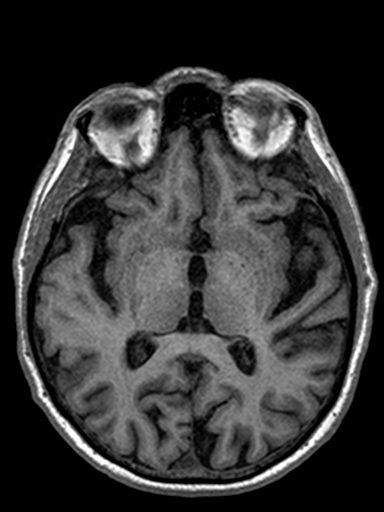
[im 93/160]
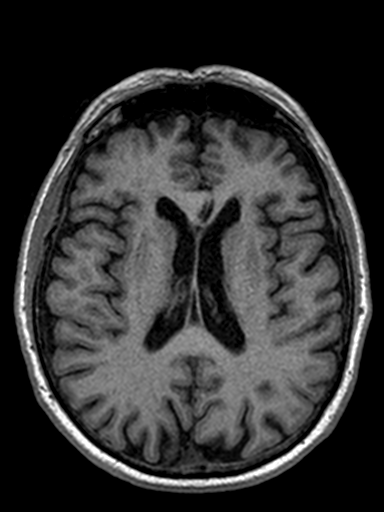
[im 107/160]
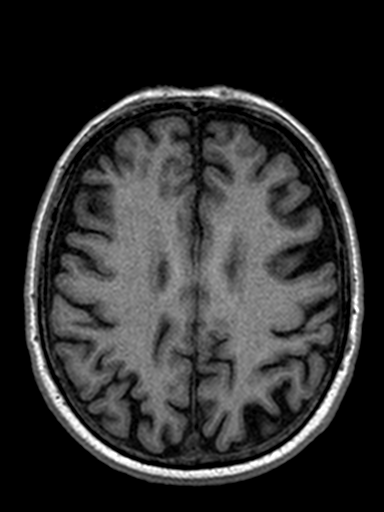
[im 120/160]
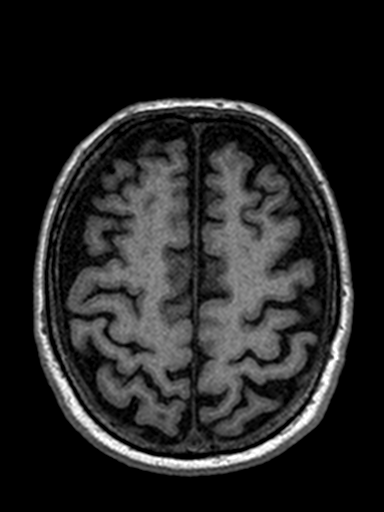
[im 133/160]
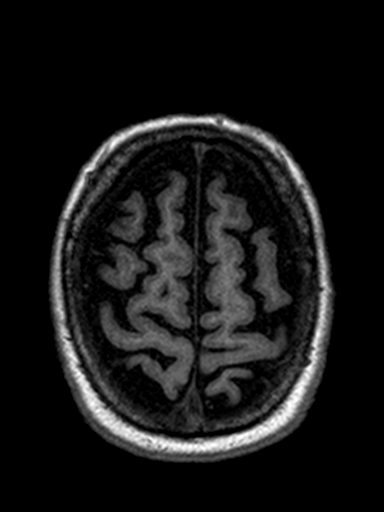
[im 160/160]
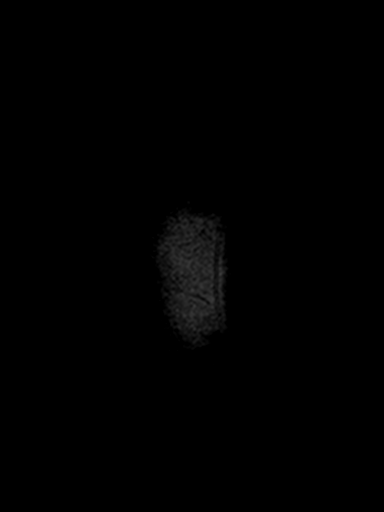

[Series 12: T2 · coronal · 5.0mm · 0.45mm/px · 2 of 26 slices shown (2 of 2)]
[im 1/26]
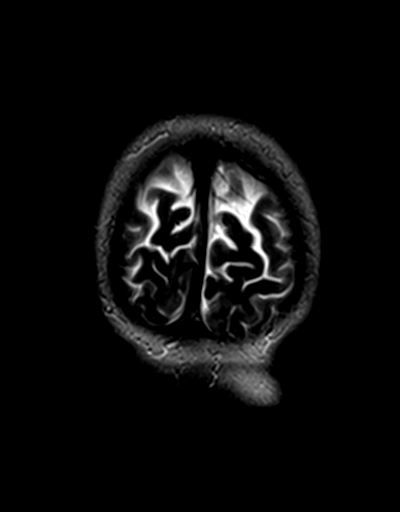
[im 26/26]
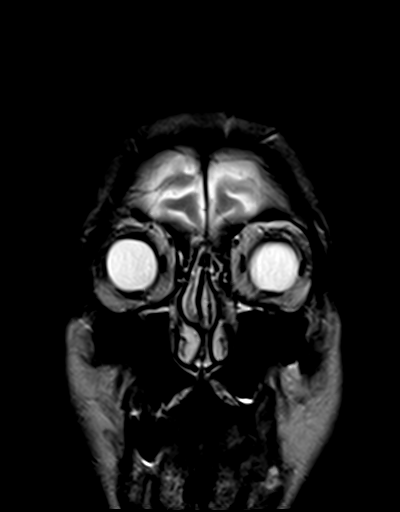

[47 of 48 positions shown; findings below may reference images not displayed]

FINDINGS: Brain: Diffusion imaging does not show any acute or subacute
infarction. There is mild age related volume loss. There are a few
small foci of T2 and FLAIR signal in the hemispheric white matter
consistent with minimal small vessel change, less than often seen in
healthy individuals of this age. No cortical or large vessel
territory infarction. No evidence of mass lesion, hemorrhage,
hydrocephalus or extra-axial collection. No pituitary mass.

Vascular: Major vessels at the base of the brain show flow.

Skull and upper cervical spine: Negative

Sinuses/Orbits: Clear/normal

Other: None significant
IMPRESSION: No cause of headache identified. Essentially normal study for age.
Mild age related volume loss and minimal small vessel change of the
hemispheric white matter.

## 2019-01-04 DIAGNOSIS — M47816 Spondylosis without myelopathy or radiculopathy, lumbar region: Secondary | ICD-10-CM | POA: Diagnosis not present

## 2019-01-04 DIAGNOSIS — M9903 Segmental and somatic dysfunction of lumbar region: Secondary | ICD-10-CM | POA: Diagnosis not present

## 2019-01-05 DIAGNOSIS — M9903 Segmental and somatic dysfunction of lumbar region: Secondary | ICD-10-CM | POA: Diagnosis not present

## 2019-01-05 DIAGNOSIS — M47816 Spondylosis without myelopathy or radiculopathy, lumbar region: Secondary | ICD-10-CM | POA: Diagnosis not present

## 2019-01-10 DIAGNOSIS — M47816 Spondylosis without myelopathy or radiculopathy, lumbar region: Secondary | ICD-10-CM | POA: Diagnosis not present

## 2019-01-10 DIAGNOSIS — M9903 Segmental and somatic dysfunction of lumbar region: Secondary | ICD-10-CM | POA: Diagnosis not present

## 2019-01-12 DIAGNOSIS — M47816 Spondylosis without myelopathy or radiculopathy, lumbar region: Secondary | ICD-10-CM | POA: Diagnosis not present

## 2019-01-12 DIAGNOSIS — M9903 Segmental and somatic dysfunction of lumbar region: Secondary | ICD-10-CM | POA: Diagnosis not present

## 2019-01-19 DIAGNOSIS — M9903 Segmental and somatic dysfunction of lumbar region: Secondary | ICD-10-CM | POA: Diagnosis not present

## 2019-01-19 DIAGNOSIS — M47816 Spondylosis without myelopathy or radiculopathy, lumbar region: Secondary | ICD-10-CM | POA: Diagnosis not present

## 2019-06-03 ENCOUNTER — Ambulatory Visit
Admission: EM | Admit: 2019-06-03 | Discharge: 2019-06-03 | Disposition: A | Discharge status: Home / Self Care | Payer: Medicare Other | Attending: Physician Assistant | Admitting: Physician Assistant

## 2019-06-03 DIAGNOSIS — F17210 Nicotine dependence, cigarettes, uncomplicated: Secondary | ICD-10-CM

## 2019-06-03 DIAGNOSIS — R220 Localized swelling, mass and lump, head: Secondary | ICD-10-CM | POA: Diagnosis not present

## 2019-06-03 DIAGNOSIS — K130 Diseases of lips: Secondary | ICD-10-CM

## 2019-06-03 MED ORDER — AMOXICILLIN-POT CLAVULANATE 875-125 MG PO TABS: 1.0000 | ORAL_TABLET | Freq: Two times a day (BID) | ORAL | 0 refills | Status: DC

## 2019-06-03 NOTE — ED Triage Notes (Signed)
Pt c/o swelling to rt cheek/lip area since yesterday. States unsure if he bit his cheek or poked it with a tooth pick.

## 2019-06-03 NOTE — Discharge Instructions (Signed)
Start Augmentin as directed. Follow up with dentist if symptoms not improving. If experiencing swelling of the throat, trouble breathing, trouble swallowing, leaning forward to breath, drooling, go to the emergency department for further evaluation.

## 2019-06-03 NOTE — ED Provider Notes (Addendum)
[facility]    CSN: [id number] Arrival date & time: 06/03/19  0913      History   Chief Complaint Chief Complaint  Patient presents with  . cheek/lip swelling    HPI Ryan Reynolds is a 83 y.o. male.   83 yo male with no significant medial history presents with right sided upper lip swelling x 2 days. He reports feeling a "bump" on the inside of his right upper lip yesterday. He then noticed his outer right upper lip was swollen. The swelling increased over night, then has decreased since first waking this morning. The area is tender. Denies trouble breathing, trouble swallowing, swelling of the throat, tripoding, drooling, trismus. He denies URI symptoms or sore throat. Denies new medicines, food, or products. Denies trauma or injury. Denies tooth or jaw pain. Denies fever or headache.      History reviewed. No pertinent past medical history.  Patient Active Problem List   Diagnosis Date Noted  . Hearing loss in left ear 07/10/2017  . Smoker 07/10/2017  . Other headache syndrome 06/20/2017    Past Surgical History:  Procedure Laterality Date  . CATARACT EXTRACTION, BILATERAL Bilateral 2016  . TONSILLECTOMY         Home Medications    Prior to Admission medications   Medication Sig Start Date End Date Taking? Authorizing Provider  amoxicillin-clavulanate (AUGMENTIN) 875-125 MG tablet Take 1 tablet by mouth every 12 (twelve) hours. 06/03/19   Belinda FisherYu, Amy V, PA-C    Family History Family History  Problem Relation Age of Onset  . Stroke Mother   . Stroke Father     Social History Social History   Tobacco Use  . Smoking status: Current Every Day Smoker    Packs/day: 1.00    Types: Cigarettes  . Smokeless tobacco: Never Used  Substance Use Topics  . Alcohol use: No  . Drug use: No     Allergies   Patient has no known allergies.   Review of Systems Review of Systems  See HPI.    Physical Exam Triage Vital Signs ED Triage Vitals  [06/03/19 0930]  Enc Vitals Group     BP 127/87     Pulse Rate (!) 115     Resp 18     Temp 98.1 F (36.7 C)     Temp Source Oral     SpO2 93 %     Weight      Height      Head Circumference      Peak Flow      Pain Score 0     Pain Loc      Pain Edu?      Excl. in GC?    No data found.  Updated Vital Signs BP 127/87 (BP Location: Left Arm)   Pulse (!) 115   Temp 98.1 F (36.7 C) (Oral)   Resp 18   SpO2 93%   Physical Exam Constitutional:      General: He is not in acute distress.    Appearance: Normal appearance. He is not ill-appearing, toxic-appearing or diaphoretic.  HENT:     Head: Normocephalic and atraumatic.     Mouth/Throat:     Mouth: Mucous membranes are moist.     Dentition: Normal dentition. No dental tenderness, gingival swelling, dental caries, dental abscesses or gum lesions.     Tongue: No lesions. Tongue does not deviate from midline.     Palate: No mass and lesions.  Pharynx: Oropharynx is clear. Uvula midline. No pharyngeal swelling, oropharyngeal exudate, posterior oropharyngeal erythema or uvula swelling.     Tonsils: No tonsillar exudate or tonsillar abscesses. 1+ on the right. 1+ on the left.     Comments: 0.2cm x 0.2cm sore noted to right inner upper lip with white coating and mild erythema. No drainage present. Area is tender to palpation. No fluctuance.  Swelling noted to outer right upper lip that extends up to edge of right nostril and out to mid right cheek. Mild erythema. Tenderness to palpation of swollen area. No areas of fluctuance. Sensation intact.  Pulmonary:     Effort: Pulmonary effort is normal. No respiratory distress.  Skin:    General: Skin is warm and dry.  Neurological:     Mental Status: He is alert and oriented to person, place, and time.     Gait: Gait normal.  Psychiatric:        Mood and Affect: Mood normal.        Behavior: Behavior normal.      UC Treatments / Results  Labs (all labs ordered are listed,  but only abnormal results are displayed) Labs Reviewed - No data to display  EKG   Radiology No results found.  Procedures Procedures (including critical care time)  Medications Ordered in UC Medications - No data to display  Initial Impression / Assessment and Plan / UC Course  I have reviewed the triage vital signs and the nursing notes.  Pertinent labs & imaging results that were available during my care of the patient were reviewed by me and considered in my medical decision making (see chart for details).   Tachycardia resolved on exam. Lower suspicion for abscess or angioedema based on exam and history. Antibiotics prescribed to cover for possible infection. Return precautions given.   Final Clinical Impressions(s) / UC Diagnoses   Final diagnoses:  Right facial swelling  Lip pain    ED Prescriptions    Medication Sig Dispense Auth. Provider   amoxicillin-clavulanate (AUGMENTIN) 875-125 MG tablet Take 1 tablet by mouth every 12 (twelve) hours. 14 tablet Ok Edwards, PA-C     PDMP not reviewed this encounter.   Ok Edwards, PA-C 06/03/19 Lowell, Amy V, PA-C 06/03/19 1037

## 2019-07-07 DIAGNOSIS — Z0120 Encounter for dental examination and cleaning without abnormal findings: Secondary | ICD-10-CM | POA: Diagnosis not present

## 2019-09-01 DIAGNOSIS — H524 Presbyopia: Secondary | ICD-10-CM | POA: Diagnosis not present

## 2019-12-07 ENCOUNTER — Telehealth: Payer: Self-pay | Admitting: General Practice

## 2019-12-07 NOTE — Telephone Encounter (Signed)
Pt called and is going to drop off his vaccination card to scan into his chart.

## 2020-02-15 DIAGNOSIS — M9903 Segmental and somatic dysfunction of lumbar region: Secondary | ICD-10-CM | POA: Diagnosis not present

## 2020-02-15 DIAGNOSIS — M47816 Spondylosis without myelopathy or radiculopathy, lumbar region: Secondary | ICD-10-CM | POA: Diagnosis not present

## 2020-02-20 DIAGNOSIS — M47816 Spondylosis without myelopathy or radiculopathy, lumbar region: Secondary | ICD-10-CM | POA: Diagnosis not present

## 2020-02-20 DIAGNOSIS — M9903 Segmental and somatic dysfunction of lumbar region: Secondary | ICD-10-CM | POA: Diagnosis not present

## 2020-02-21 DIAGNOSIS — M47816 Spondylosis without myelopathy or radiculopathy, lumbar region: Secondary | ICD-10-CM | POA: Diagnosis not present

## 2020-02-21 DIAGNOSIS — M9903 Segmental and somatic dysfunction of lumbar region: Secondary | ICD-10-CM | POA: Diagnosis not present

## 2020-02-28 DIAGNOSIS — M9903 Segmental and somatic dysfunction of lumbar region: Secondary | ICD-10-CM | POA: Diagnosis not present

## 2020-02-28 DIAGNOSIS — M47816 Spondylosis without myelopathy or radiculopathy, lumbar region: Secondary | ICD-10-CM | POA: Diagnosis not present

## 2020-03-07 DIAGNOSIS — M9903 Segmental and somatic dysfunction of lumbar region: Secondary | ICD-10-CM | POA: Diagnosis not present

## 2020-03-07 DIAGNOSIS — M47816 Spondylosis without myelopathy or radiculopathy, lumbar region: Secondary | ICD-10-CM | POA: Diagnosis not present

## 2020-03-14 DIAGNOSIS — M9903 Segmental and somatic dysfunction of lumbar region: Secondary | ICD-10-CM | POA: Diagnosis not present

## 2020-03-14 DIAGNOSIS — M47816 Spondylosis without myelopathy or radiculopathy, lumbar region: Secondary | ICD-10-CM | POA: Diagnosis not present

## 2022-09-21 ENCOUNTER — Other Ambulatory Visit: Payer: Self-pay

## 2022-09-21 ENCOUNTER — Encounter (HOSPITAL_COMMUNITY): Payer: Self-pay

## 2022-09-21 ENCOUNTER — Emergency Department (HOSPITAL_COMMUNITY): Payer: Medicare Other

## 2022-09-21 ENCOUNTER — Inpatient Hospital Stay (HOSPITAL_COMMUNITY)
Admission: EM | Admit: 2022-09-21 | Discharge: 2022-10-16 | DRG: 871 | Disposition: E | Payer: Medicare Other | Attending: Internal Medicine | Admitting: Internal Medicine

## 2022-09-21 DIAGNOSIS — N289 Disorder of kidney and ureter, unspecified: Secondary | ICD-10-CM

## 2022-09-21 DIAGNOSIS — J439 Emphysema, unspecified: Secondary | ICD-10-CM | POA: Diagnosis present

## 2022-09-21 DIAGNOSIS — Z7189 Other specified counseling: Secondary | ICD-10-CM | POA: Diagnosis not present

## 2022-09-21 DIAGNOSIS — I4891 Unspecified atrial fibrillation: Secondary | ICD-10-CM | POA: Diagnosis not present

## 2022-09-21 DIAGNOSIS — Z9841 Cataract extraction status, right eye: Secondary | ICD-10-CM

## 2022-09-21 DIAGNOSIS — J189 Pneumonia, unspecified organism: Secondary | ICD-10-CM | POA: Diagnosis not present

## 2022-09-21 DIAGNOSIS — R531 Weakness: Secondary | ICD-10-CM | POA: Diagnosis not present

## 2022-09-21 DIAGNOSIS — J939 Pneumothorax, unspecified: Secondary | ICD-10-CM | POA: Diagnosis not present

## 2022-09-21 DIAGNOSIS — Z6824 Body mass index (BMI) 24.0-24.9, adult: Secondary | ICD-10-CM

## 2022-09-21 DIAGNOSIS — L89221 Pressure ulcer of left hip, stage 1: Secondary | ICD-10-CM | POA: Diagnosis present

## 2022-09-21 DIAGNOSIS — I4510 Unspecified right bundle-branch block: Secondary | ICD-10-CM | POA: Diagnosis present

## 2022-09-21 DIAGNOSIS — R000 Tachycardia, unspecified: Secondary | ICD-10-CM | POA: Diagnosis not present

## 2022-09-21 DIAGNOSIS — Z79899 Other long term (current) drug therapy: Secondary | ICD-10-CM

## 2022-09-21 DIAGNOSIS — J929 Pleural plaque without asbestos: Secondary | ICD-10-CM | POA: Diagnosis not present

## 2022-09-21 DIAGNOSIS — J918 Pleural effusion in other conditions classified elsewhere: Secondary | ICD-10-CM | POA: Diagnosis not present

## 2022-09-21 DIAGNOSIS — F17210 Nicotine dependence, cigarettes, uncomplicated: Secondary | ICD-10-CM | POA: Diagnosis present

## 2022-09-21 DIAGNOSIS — I4892 Unspecified atrial flutter: Secondary | ICD-10-CM | POA: Diagnosis present

## 2022-09-21 DIAGNOSIS — I959 Hypotension, unspecified: Secondary | ICD-10-CM | POA: Diagnosis present

## 2022-09-21 DIAGNOSIS — I480 Paroxysmal atrial fibrillation: Secondary | ICD-10-CM | POA: Diagnosis not present

## 2022-09-21 DIAGNOSIS — J9811 Atelectasis: Secondary | ICD-10-CM | POA: Diagnosis not present

## 2022-09-21 DIAGNOSIS — Z515 Encounter for palliative care: Secondary | ICD-10-CM

## 2022-09-21 DIAGNOSIS — Z823 Family history of stroke: Secondary | ICD-10-CM

## 2022-09-21 DIAGNOSIS — Z66 Do not resuscitate: Secondary | ICD-10-CM | POA: Diagnosis not present

## 2022-09-21 DIAGNOSIS — L89151 Pressure ulcer of sacral region, stage 1: Secondary | ICD-10-CM | POA: Diagnosis present

## 2022-09-21 DIAGNOSIS — E041 Nontoxic single thyroid nodule: Secondary | ICD-10-CM | POA: Diagnosis present

## 2022-09-21 DIAGNOSIS — R627 Adult failure to thrive: Secondary | ICD-10-CM | POA: Diagnosis present

## 2022-09-21 DIAGNOSIS — F419 Anxiety disorder, unspecified: Secondary | ICD-10-CM | POA: Diagnosis present

## 2022-09-21 DIAGNOSIS — J9601 Acute respiratory failure with hypoxia: Secondary | ICD-10-CM | POA: Diagnosis present

## 2022-09-21 DIAGNOSIS — A419 Sepsis, unspecified organism: Principal | ICD-10-CM | POA: Diagnosis present

## 2022-09-21 DIAGNOSIS — Z85118 Personal history of other malignant neoplasm of bronchus and lung: Secondary | ICD-10-CM

## 2022-09-21 DIAGNOSIS — J910 Malignant pleural effusion: Secondary | ICD-10-CM | POA: Diagnosis present

## 2022-09-21 DIAGNOSIS — J90 Pleural effusion, not elsewhere classified: Secondary | ICD-10-CM | POA: Diagnosis not present

## 2022-09-21 DIAGNOSIS — R64 Cachexia: Secondary | ICD-10-CM | POA: Diagnosis present

## 2022-09-21 DIAGNOSIS — H9192 Unspecified hearing loss, left ear: Secondary | ICD-10-CM | POA: Diagnosis present

## 2022-09-21 DIAGNOSIS — R296 Repeated falls: Secondary | ICD-10-CM | POA: Diagnosis present

## 2022-09-21 DIAGNOSIS — J984 Other disorders of lung: Secondary | ICD-10-CM | POA: Diagnosis present

## 2022-09-21 DIAGNOSIS — R6520 Severe sepsis without septic shock: Secondary | ICD-10-CM | POA: Diagnosis present

## 2022-09-21 DIAGNOSIS — J690 Pneumonitis due to inhalation of food and vomit: Secondary | ICD-10-CM | POA: Diagnosis present

## 2022-09-21 DIAGNOSIS — L8990 Pressure ulcer of unspecified site, unspecified stage: Secondary | ICD-10-CM | POA: Insufficient documentation

## 2022-09-21 DIAGNOSIS — J159 Unspecified bacterial pneumonia: Secondary | ICD-10-CM | POA: Diagnosis not present

## 2022-09-21 DIAGNOSIS — R54 Age-related physical debility: Secondary | ICD-10-CM | POA: Diagnosis present

## 2022-09-21 DIAGNOSIS — R1310 Dysphagia, unspecified: Secondary | ICD-10-CM | POA: Diagnosis not present

## 2022-09-21 DIAGNOSIS — I2510 Atherosclerotic heart disease of native coronary artery without angina pectoris: Secondary | ICD-10-CM | POA: Diagnosis present

## 2022-09-21 DIAGNOSIS — R9431 Abnormal electrocardiogram [ECG] [EKG]: Secondary | ICD-10-CM | POA: Diagnosis not present

## 2022-09-21 DIAGNOSIS — I700 Atherosclerosis of aorta: Secondary | ICD-10-CM | POA: Diagnosis not present

## 2022-09-21 DIAGNOSIS — Z9842 Cataract extraction status, left eye: Secondary | ICD-10-CM

## 2022-09-21 DIAGNOSIS — N179 Acute kidney failure, unspecified: Secondary | ICD-10-CM | POA: Diagnosis present

## 2022-09-21 DIAGNOSIS — Z1152 Encounter for screening for COVID-19: Secondary | ICD-10-CM | POA: Diagnosis not present

## 2022-09-21 DIAGNOSIS — M6289 Other specified disorders of muscle: Secondary | ICD-10-CM | POA: Diagnosis not present

## 2022-09-21 DIAGNOSIS — Z4682 Encounter for fitting and adjustment of non-vascular catheter: Secondary | ICD-10-CM | POA: Diagnosis not present

## 2022-09-21 DIAGNOSIS — R001 Bradycardia, unspecified: Secondary | ICD-10-CM | POA: Diagnosis not present

## 2022-09-21 DIAGNOSIS — E860 Dehydration: Secondary | ICD-10-CM | POA: Diagnosis present

## 2022-09-21 DIAGNOSIS — J869 Pyothorax without fistula: Secondary | ICD-10-CM | POA: Diagnosis not present

## 2022-09-21 DIAGNOSIS — M7989 Other specified soft tissue disorders: Secondary | ICD-10-CM | POA: Diagnosis not present

## 2022-09-21 DIAGNOSIS — Z86011 Personal history of benign neoplasm of the brain: Secondary | ICD-10-CM

## 2022-09-21 DIAGNOSIS — R918 Other nonspecific abnormal finding of lung field: Secondary | ICD-10-CM | POA: Diagnosis not present

## 2022-09-21 DIAGNOSIS — R609 Edema, unspecified: Secondary | ICD-10-CM | POA: Diagnosis not present

## 2022-09-21 LAB — COMPREHENSIVE METABOLIC PANEL
ALT: 33 U/L (ref 0–44)
AST: 23 U/L (ref 15–41)
Albumin: 2 g/dL — ABNORMAL LOW (ref 3.5–5.0)
Alkaline Phosphatase: 71 U/L (ref 38–126)
Anion gap: 9 (ref 5–15)
BUN: 50 mg/dL — ABNORMAL HIGH (ref 8–23)
CO2: 24 mmol/L (ref 22–32)
Calcium: 9.6 mg/dL (ref 8.9–10.3)
Chloride: 101 mmol/L (ref 98–111)
Creatinine, Ser: 1.38 mg/dL — ABNORMAL HIGH (ref 0.61–1.24)
GFR, Estimated: 50 mL/min — ABNORMAL LOW (ref >60)
Glucose, Bld: 188 mg/dL — ABNORMAL HIGH (ref 70–99)
Potassium: 5.1 mmol/L (ref 3.5–5.1)
Sodium: 134 mmol/L — ABNORMAL LOW (ref 135–145)
Total Bilirubin: 0.6 mg/dL (ref 0.3–1.2)
Total Protein: 6.1 g/dL — ABNORMAL LOW (ref 6.5–8.1)

## 2022-09-21 LAB — TROPONIN I (HIGH SENSITIVITY)
Troponin I (High Sensitivity): 24 ng/L — ABNORMAL HIGH (ref <18)
Troponin I (High Sensitivity): 20 ng/L — ABNORMAL HIGH (ref <18)

## 2022-09-21 LAB — CBC
HCT: 37.5 % — ABNORMAL LOW (ref 39.0–52.0)
Hemoglobin: 11.9 g/dL — ABNORMAL LOW (ref 13.0–17.0)
MCH: 28.8 pg (ref 26.0–34.0)
MCHC: 31.7 g/dL (ref 30.0–36.0)
MCV: 90.8 fL (ref 80.0–100.0)
Platelets: 510 10*3/uL — ABNORMAL HIGH (ref 150–400)
RBC: 4.13 MIL/uL — ABNORMAL LOW (ref 4.22–5.81)
RDW: 13.2 % (ref 11.5–15.5)
WBC: 25.5 10*3/uL — ABNORMAL HIGH (ref 4.0–10.5)
nRBC: 0 % (ref 0.0–0.2)

## 2022-09-21 LAB — MAGNESIUM: Magnesium: 1.3 mg/dL — ABNORMAL LOW (ref 1.7–2.4)

## 2022-09-21 LAB — LACTIC ACID, PLASMA: Lactic Acid, Venous: 1.9 mmol/L (ref 0.5–1.9)

## 2022-09-21 LAB — TSH: TSH: 1.135 u[IU]/mL (ref 0.350–4.500)

## 2022-09-21 LAB — BRAIN NATRIURETIC PEPTIDE: B Natriuretic Peptide: 156.1 pg/mL — ABNORMAL HIGH (ref 0.0–100.0)

## 2022-09-21 MED ORDER — SENNOSIDES-DOCUSATE SODIUM 8.6-50 MG PO TABS: 1.0000 | ORAL_TABLET | Freq: Every evening | ORAL | Status: DC | PRN

## 2022-09-21 MED ORDER — ACETAMINOPHEN 650 MG RE SUPP: 650.0000 mg | Freq: Four times a day (QID) | RECTAL | Status: DC | PRN

## 2022-09-21 MED ORDER — ONDANSETRON HCL 4 MG PO TABS: 4.0000 mg | ORAL_TABLET | Freq: Four times a day (QID) | ORAL | Status: DC | PRN

## 2022-09-21 MED ORDER — SODIUM CHLORIDE 0.9% FLUSH
3.0000 mL | Freq: Two times a day (BID) | INTRAVENOUS | Status: DC
  Administered 2022-09-22 – 2022-09-27 (×10): 3 mL via INTRAVENOUS

## 2022-09-21 MED ORDER — SODIUM CHLORIDE 0.9 % IV BOLUS
1000.0000 mL | Freq: Once | INTRAVENOUS | Status: AC
  Administered 2022-09-21: 1000 mL via INTRAVENOUS

## 2022-09-21 MED ORDER — SODIUM CHLORIDE 0.9 % IV SOLN
2.0000 g | INTRAVENOUS | Status: DC
  Administered 2022-09-21 – 2022-09-24 (×4): 2 g via INTRAVENOUS
  Filled 2022-09-21 (×5): qty 20

## 2022-09-21 MED ORDER — SODIUM CHLORIDE 0.9 % IV SOLN: INTRAVENOUS | Status: AC

## 2022-09-21 MED ORDER — ONDANSETRON HCL 4 MG/2ML IJ SOLN: 4.0000 mg | Freq: Four times a day (QID) | INTRAMUSCULAR | Status: DC | PRN

## 2022-09-21 MED ORDER — MAGNESIUM SULFATE 2 GM/50ML IV SOLN
2.0000 g | Freq: Once | INTRAVENOUS | Status: AC
  Administered 2022-09-21: 2 g via INTRAVENOUS
  Filled 2022-09-21: qty 50

## 2022-09-21 MED ORDER — ACETAMINOPHEN 325 MG PO TABS: 650.0000 mg | ORAL_TABLET | Freq: Four times a day (QID) | ORAL | Status: DC | PRN

## 2022-09-21 MED ORDER — LACTATED RINGERS IV BOLUS (SEPSIS)
1500.0000 mL | Freq: Once | INTRAVENOUS | Status: AC
  Administered 2022-09-21: 1000 mL via INTRAVENOUS

## 2022-09-21 MED ORDER — SODIUM CHLORIDE 0.9 % IV SOLN
500.0000 mg | INTRAVENOUS | Status: DC
  Administered 2022-09-21 – 2022-09-24 (×4): 500 mg via INTRAVENOUS
  Filled 2022-09-21 (×4): qty 5

## 2022-09-21 NOTE — ED Notes (Signed)
PT to CT.

## 2022-09-21 NOTE — Hospital Course (Signed)
Ryan Reynolds is a 87 y.o. male with medical history significant for tobacco use who is admitted with sepsis due to large loculated left pleural effusion/empyema.

## 2022-09-21 NOTE — ED Notes (Signed)
Pt's O2 dropped to 88% while sleeping, RN placed 2L [place] on pt and his O2 improved to WNL.

## 2022-09-21 NOTE — H&P (Signed)
+ History and Physical    Ryan Reynolds CBJ:[id number] DOB: 10/16/1935 DOA: 10/07/2022  PCP: Patient, No Pcp Per  Patient coming from: Home  I have personally briefly reviewed patient's old medical records in [facility]  Chief Complaint: Fatigue, weakness  HPI: Ryan Reynolds is a 87 y.o. male with medical history significant for tobacco use who presented to the ED for evaluation of cough and fatigue.  Patient states about 2 weeks ago he was feeling fatigued and lightheaded.  He has been having progressive weakness, fatigue, and cough occasionally productive of clear sputum.  Today he felt so weak he could not stand up out of his chair.  He says he has only had intermittent dyspnea.  He says he quit smoking August 11, 2022.  He was previously smoking 0.5-1.0 PPD since age 79.  He does not take any medications regularly.  ED Course  Labs/Imaging on admission: I have personally reviewed following labs and imaging studies.  Initial vitals showed BP 85/61, pulse 114, RR 21, temp 100.9 F rectally, SpO2 93% on room air.  Labs showed WBC 25.5, hemoglobin 11.9, platelets 510,000, sodium 134, potassium 5.1, bicarb 24, BUN 50, creatinine 1.38, serum glucose 188, LFTs within normal limits, troponin 24 Greenstein 20, lactic acid 1.9, BNP 156.1, TSH 1.135.  Magnesium 1.3.  Portable chest x-ray showed complete opacification of the left hemithorax.  CT chest without contrast showed large loculated left pleural effusion suspicious for empyema.  Malignant effusion not excluded.  Large amount of secretions within the trachea and left lung bronchi with postobstructive atelectasis/pneumonia noted.  1.9 cm left thyroid nodule also noted.  Patient was given 1 L normal saline, 1.5 L LR, IV ceftriaxone and azithromycin.  EDP discussed with on-call pulmonology who recommended IR eval for fluid sample plus or minus drain tomorrow.  The hospitalist service was consulted to admit for further evaluation  and management.  Review of Systems: All systems reviewed and are negative except as documented in history of present illness above.   History reviewed. No pertinent past medical history.  Past Surgical History:  Procedure Laterality Date   CATARACT EXTRACTION, BILATERAL Bilateral 2016   TONSILLECTOMY      Social History:  reports that he has been smoking cigarettes. He has been smoking an average of 1 pack per day. He has never used smokeless tobacco. He reports that he does not drink alcohol and does not use drugs.  No Known Allergies  Family History  Problem Relation Age of Onset   Stroke Mother    Stroke Father      Prior to Admission medications   Medication Sig Start Date End Date Taking? Authorizing Provider  amoxicillin-clavulanate (AUGMENTIN) 875-125 MG tablet Take 1 tablet by mouth every 12 (twelve) hours. 06/03/19   Belinda Fisher, PA-C    Physical Exam: Vitals:   10/06/2022 2115 10/01/2022 2130 10/15/2022 2208 09/15/2022 2208  BP: 92/63 90/62 108/74   Pulse: (!) 105 89 (!) 105   Resp: (!) 28 10 (!) 23   Temp:    97.8 F (36.6 C)  TempSrc:    Oral  SpO2: (!) 86% 90% 93%   Weight:      Height:       Constitutional: Elderly man resting supine in bed.  NAD, calm, comfortable Eyes: EOMI, lids and conjunctivae normal ENMT: Mucous membranes are moist. Posterior pharynx clear of any exudate or lesions.Normal dentition.  Neck: normal, supple, no masses. Respiratory: Diminished breath sounds left lung  field, clear on the right.  Wet upper airway sounds. Cardiovascular: Regular rate and rhythm, no murmurs / rubs / gallops. No extremity edema. 2+ pedal pulses. Abdomen: no tenderness, no masses palpated.  Musculoskeletal: no clubbing / cyanosis. No joint deformity upper and lower extremities. Good ROM, no contractures. Normal muscle tone.  Skin: no rashes, lesions, ulcers. No induration Neurologic: Sensation intact. Strength 5/5 in all 4.  Psychiatric:  Alert and oriented x 3.  Normal mood.   EKG: Pending  Assessment/Plan Principal Problem:   Sepsis ([place]) Active Problems:   Loculated left pleural effusion   Acute respiratory failure with hypoxia (HCC)   Atrial fibrillation (HCC)   Hypomagnesemia   Renal insufficiency   Thyroid nodule greater than or equal to 1.5 cm in diameter incidentally noted on imaging study   Ryan Reynolds is a 87 y.o. male with medical history significant for tobacco use who is admitted with sepsis due to large loculated left pleural effusion/empyema.  Assessment and Plan:  Sepsis due to large loculated left pleural effusion/empyema with postobstructive atelectasis/pneumonia: Presenting with leukocytosis, fever, tachycardia, tachypnea, hypotension and imaging consistent with a large left loculated pleural effusion concerning for empyema versus malignant effusion.  Postobstructive atelectasis/pneumonia also noted.  EDP discussed with on-call PCCM who recommended IR evaluation for fluid sample +/- drain. -Continue IV ceftriaxone and azithromycin -Follow blood cultures, sputum culture, strep pneumoniae urinary antigen -Continue supplemental O2 as needed, BiPAP as needed -IR eval order placed -Request cell count with differential, Gram stain, pleural fluid culture, cytology, LDH, protein, glucose if pleural fluid obtained  Hypotension: In setting of sepsis.  BP stabilizing with IV fluids.  Continue to IV fluid hydration.  Acute respiratory failure with hypoxia: Secondary to large left pleural effusion.  SpO2 as low as 86%, stable on 2 L O2 via [place].  Renal insufficiency: Creatinine 1.3 on admission, no prior labs for baseline comparison.  Continue management as above and repeat labs in AM.  Atrial fibrillation?: Per EDP, patient with transient A-fib with spontaneous conversion to sinus rhythm.  Obtain EKG, keep on telemetry.  Hypomagnesemia: IV supplement ordered.  Thyroid nodule: 1.9 cm left thyroid nodule noted on CT.   Consider nonemergent ultrasound.   DVT prophylaxis: SCDs Start: 10/07/2022 2316 Code Status: DNR, confirmed with patient on admission Family Communication: Discussed with patient, he has discussed with family Disposition Plan: From home and likely discharge to home pending clinical progress Consults called: EDP discussed with PCCM (Dr. Ruthann Cancer) Severity of Illness: The appropriate patient status for this patient is INPATIENT. Inpatient status is judged to be reasonable and necessary in order to provide the required intensity of service to ensure the patient's safety. The patient's presenting symptoms, physical exam findings, and initial radiographic and laboratory data in the context of their chronic comorbidities is felt to place them at high risk for further clinical deterioration. Furthermore, it is not anticipated that the patient will be medically stable for discharge from the hospital within 2 midnights of admission.   * I certify that at the point of admission it is my clinical judgment that the patient will require inpatient hospital care spanning beyond 2 midnights from the point of admission due to high intensity of service, high risk for further deterioration and high frequency of surveillance required.Zada Finders MD [facility]  If 7PM-7AM, please contact night-coverage [email or web address]  09/15/2022, 11:31 PM

## 2022-09-21 NOTE — ED Provider Notes (Addendum)
[facility] DEPT Provider Note   CSN: [id number] Arrival date & time: 2022-10-14  1720     History  Chief Complaint  Patient presents with   Weakness    Ryan Reynolds is a 87 y.o. male.  HPI Patient arrives via EMS from home due to concern for fatigue, weakness, nausea.  Patient has had began feeling dizzy about 2 weeks ago, since that time has been abnormal.  He states that he is otherwise generally well, was well prior to this.  No focal pain, no dyspnea, no confusion.  He has had several falls, has generalized, not focal pain.  He denies history of atrial fibrillation.  EMS reports patient was tachycardic, hypotensive en route.    Home Medications Prior to Admission medications   Medication Sig Start Date End Date Taking? Authorizing Provider  amoxicillin-clavulanate (AUGMENTIN) 875-125 MG tablet Take 1 tablet by mouth every 12 (twelve) hours. 06/03/19   Ok Edwards, PA-C      Allergies    Patient has no known allergies.    Review of Systems   Review of Systems  All other systems reviewed and are negative.   Physical Exam Updated Vital Signs BP 108/74   Pulse (!) 105   Temp 97.8 F (36.6 C) (Oral)   Resp (!) 23   Ht 6\' 1"  (1.854 m)   Wt 82 kg   SpO2 93%   BMI 23.85 kg/m  Physical Exam Vitals and nursing note reviewed.  Constitutional:      General: He is not in acute distress.    Appearance: He is well-developed. He is ill-appearing. He is not toxic-appearing.  HENT:     Head: Normocephalic and atraumatic.  Eyes:     Conjunctiva/sclera: Conjunctivae normal.  Cardiovascular:     Rate and Rhythm: Tachycardia present. Rhythm irregular.  Pulmonary:     Effort: Pulmonary effort is normal. No respiratory distress.     Breath sounds: No stridor.  Abdominal:     General: There is no distension.  Skin:    General: Skin is warm and dry.  Neurological:     Mental Status: He is alert and oriented to person, place, and time.   Psychiatric:        Mood and Affect: Mood normal.     ED Results / Procedures / Treatments   Labs (all labs ordered are listed, but only abnormal results are displayed) Labs Reviewed  MAGNESIUM - Abnormal; Notable for the following components:      Result Value   Magnesium 1.3 (*)    All other components within normal limits  CBC - Abnormal; Notable for the following components:   WBC 25.5 (*)    RBC 4.13 (*)    Hemoglobin 11.9 (*)    HCT 37.5 (*)    Platelets 510 (*)    All other components within normal limits  COMPREHENSIVE METABOLIC PANEL - Abnormal; Notable for the following components:   Sodium 134 (*)    Glucose, Bld 188 (*)    BUN 50 (*)    Creatinine, Ser 1.38 (*)    Total Protein 6.1 (*)    Albumin 2.0 (*)    GFR, Estimated 50 (*)    All other components within normal limits  BRAIN NATRIURETIC PEPTIDE - Abnormal; Notable for the following components:   B Natriuretic Peptide 156.1 (*)    All other components within normal limits  TROPONIN I (HIGH SENSITIVITY) - Abnormal; Notable for the following components:  Troponin I (High Sensitivity) 24 (*)    All other components within normal limits  TROPONIN I (HIGH SENSITIVITY) - Abnormal; Notable for the following components:   Troponin I (High Sensitivity) 20 (*)    All other components within normal limits  CULTURE, BLOOD (SINGLE)  TSH  LACTIC ACID, PLASMA    EKG 107 A-fib stable ventricular response  Radiology CT CHEST WO CONTRAST  Result Date: October 04, 2022 CLINICAL DATA:  Pneumonia complication suspected. X-ray done showing pleural effusion. Malignancy suspected. Congestion and cough EXAM: CT CHEST WITHOUT CONTRAST TECHNIQUE: Multidetector CT imaging of the chest was performed following the standard protocol without IV contrast. RADIATION DOSE REDUCTION: This exam was performed according to the departmental dose-optimization program which includes automated exposure control, adjustment of the mA and/or kV  according to patient size and/or use of iterative reconstruction technique. COMPARISON:  Radiographs earlier today FINDINGS: Cardiovascular: Normal heart size. Small pericardial effusion. Aortic valve and mitral annular calcification. Coronary artery and aortic atherosclerotic calcification. Mediastinum/Nodes: 1.9 cm left thyroid nodule. Unremarkable esophagus. No definite thoracic adenopathy on noncontrast exam. Lungs/Pleura: Layering secretions in the trachea extending into the left mainstem bronchus where there is near occlusion. Mucous plugging throughout the left lingular and lower lobe bronchi. Mild diffuse bronchial wall thickening. Large loculated pleural effusion on the left. There is suggestion of wall thickening about the loculated effusions compatible with empyemas though this is incompletely evaluated without IV contrast. Near-complete collapse of both the left upper and lower lobes. Right posterior lower atelectasis/scarring and pleural thickening. Emphysema. Upper Abdomen: Poorly evaluated.  No definite acute abnormality. Musculoskeletal: No acute osseous abnormality. IMPRESSION: Large loculated left pleural effusion. This is suspicious for empyemas though is incompletely evaluated without IV contrast. Malignant effusion is not excluded. Large amount of secretions within the trachea and left lung bronchi with postobstructive atelectasis/pneumonia. Coronary artery atherosclerosis. Aortic Atherosclerosis (ICD10-I70.0) and Emphysema (ICD10-J43.9). 1.9 cm left thyroid nodule. Nonemergent ultrasound could be performed if clinically indicated given comorbidities and life expectancy. Electronically Signed   By: Minerva Fester M.D.   On: October 04, 2022 20:36   DG Chest Port 1 View  Result Date: Oct 04, 2022 CLINICAL DATA:  New onset AFib EXAM: PORTABLE CHEST 1 VIEW COMPARISON:  None. FINDINGS: Cardiac silhouette appears enlarged. There is complete opacification of the left hemithorax. Right lung is clear. No  pneumothorax. No acute fractures. IMPRESSION: Complete opacification of the left hemithorax, likely combination of pleural effusion and atelectasis/consolidation. Electronically Signed   By: Darliss Cheney M.D.   On: 2022-10-04 18:36    Procedures Procedures    Medications Ordered in ED Medications  sodium chloride 0.9 % bolus 1,000 mL (0 mLs Intravenous Stopped October 04, 2022 1916)    And  0.9 %  sodium chloride infusion ( Intravenous New Bag/Given 10/04/2022 1805)  cefTRIAXone (ROCEPHIN) 2 g in sodium chloride 0.9 % 100 mL IVPB (0 g Intravenous Stopped 10/04/22 1916)  azithromycin (ZITHROMAX) 500 mg in sodium chloride 0.9 % 250 mL IVPB (0 mg Intravenous Stopped 2022-10-04 2010)  lactated ringers bolus 1,500 mL (0 mLs Intravenous Stopped 10/04/2022 2218)    ED Course/ Medical Decision Making/ A&P This patient with a Hx of headaches, otherwise generally well presents to the ED for concern of weakness, fatigue, dizziness with new frequency of falls all over the past 2 weeks, this involves an extensive number of treatment options, and is a complaint that carries with it a high risk of complications and morbidity.    The differential diagnosis includes with initial A-fib, hypotension on  arrival suspicion for this contributing, other consideration include bacteremia, sepsis, pneumonia, malignancy, electrolyte abnormalities, dehydration   Social Determinants of Health:  Advanced age  Additional history obtained:  Additional history and/or information obtained from EMS report, notable for details included above in HPI   After the initial evaluation, orders, including: Labs monitoring fluids were initiated.   Patient placed on Cardiac and Pulse-Oximetry Monitors. The patient was maintained on a cardiac monitor.  The cardiac monitored showed an rhythm of 115 A-fib abnormal The patient was also maintained on pulse oximetry. The readings were typically 99% room air normal   On repeat evaluation of the patient  improved 10:20 PM Clinically patient has improved, but numerically patient has not.  Patient now hypotensive, with fever, and with x-ray reviewed, concerning for opacification of the hemithorax patient will receive antibiotics for pneumonia, was distended as a code sepsis.  He has already received 1 L fluid resuscitation will receive remaining 30 mL/kg based on body weight.  Lab Tests:  I personally interpreted labs.  The pertinent results include: Leukocytosis  Imaging Studies ordered:  I independently visualized and interpreted imaging which showed opacification left hemithorax concern for pneumonia or effusion.  Follow-up CT performed, empyema versus effusion versus malignancy with consideration of postobstructive pneumonia I agree with the radiologist interpretation Dispostion / Final MDM: After results were obtained, on repeat evaluation the patient he had cardioversion, heart rate now less than 90, sinus.  He noted that he felt better, following fluid resuscitation.  Troponin decreased from initial 24, second 20.  Low suspicion for metabolic demand contributing to that mild elevation.  I discussed the patient's case with the critical care colleagues, internal medicine colleagues.  Per critical care, and this seems reasonable, the patient would benefit from IR consult tomorrow for consideration of drain versus aspiration to facilitate fluid analysis, with consideration of malignancy versus effusion versus empyema.  Though he had evidence for new atrial fibrillation, given unclear pathology in his thorax, need for aspiration tomorrow, anticoagulation not started, though this may be considered tomorrow.  He has received broad-spectrum antibiotics, has had fluid resuscitation, his markedly improved clinically, with all of the above concerns, he required admission with continuous monitoring.   Final Clinical Impression(s) / ED Diagnoses Final diagnoses:  Severe sepsis [facility])     Gerhard Munch, MD 10/01/2022 2222 CRITICAL CARE Performed by: Gerhard Munch Total critical care time: 35 minutes Critical care time was exclusive of separately billable procedures and treating other patients. Critical care was necessary to treat or prevent imminent or life-threatening deterioration. Critical care was time spent personally by me on the following activities: development of treatment plan with patient and/or surrogate as well as nursing, discussions with consultants, evaluation of patient's response to treatment, examination of patient, obtaining history from patient or surrogate, ordering and performing treatments and interventions, ordering and review of laboratory studies, ordering and review of radiographic studies, pulse oximetry and re-evaluation of patient's condition.    Gerhard Munch, MD 09/27/2022 2223

## 2022-09-21 NOTE — ED Triage Notes (Signed)
BIBA from home for weakness, fatigue, and nausea x2 days with multiple falls. Generalized pain from falls.  Ems vitals: Cbg-233 BP-91/60 HR-110 A&O x4 on arrival

## 2022-09-21 NOTE — Progress Notes (Signed)
Pt being followed by [facility] for Sepsis protocol. 

## 2022-09-22 ENCOUNTER — Inpatient Hospital Stay (HOSPITAL_COMMUNITY): Payer: Medicare Other

## 2022-09-22 ENCOUNTER — Encounter (HOSPITAL_COMMUNITY): Payer: Self-pay | Admitting: Internal Medicine

## 2022-09-22 DIAGNOSIS — J9601 Acute respiratory failure with hypoxia: Secondary | ICD-10-CM | POA: Diagnosis not present

## 2022-09-22 DIAGNOSIS — J90 Pleural effusion, not elsewhere classified: Secondary | ICD-10-CM | POA: Diagnosis not present

## 2022-09-22 LAB — CBC
HCT: 35.1 % — ABNORMAL LOW (ref 39.0–52.0)
HCT: 36.7 % — ABNORMAL LOW (ref 39.0–52.0)
Hemoglobin: 10.9 g/dL — ABNORMAL LOW (ref 13.0–17.0)
Hemoglobin: 10.6 g/dL — ABNORMAL LOW (ref 13.0–17.0)
MCH: 28.6 pg (ref 26.0–34.0)
MCH: 28.8 pg (ref 26.0–34.0)
MCHC: 31.1 g/dL (ref 30.0–36.0)
MCHC: 28.9 g/dL — ABNORMAL LOW (ref 30.0–36.0)
MCV: 92.1 fL (ref 80.0–100.0)
MCV: 99.7 fL (ref 80.0–100.0)
Platelets: 412 10*3/uL — ABNORMAL HIGH (ref 150–400)
Platelets: 430 10*3/uL — ABNORMAL HIGH (ref 150–400)
RBC: 3.81 MIL/uL — ABNORMAL LOW (ref 4.22–5.81)
RBC: 3.68 MIL/uL — ABNORMAL LOW (ref 4.22–5.81)
RDW: 13.4 % (ref 11.5–15.5)
RDW: 13.5 % (ref 11.5–15.5)
WBC: 26.7 10*3/uL — ABNORMAL HIGH (ref 4.0–10.5)
WBC: 28.7 10*3/uL — ABNORMAL HIGH (ref 4.0–10.5)
nRBC: 0 % (ref 0.0–0.2)
nRBC: 0 % (ref 0.0–0.2)

## 2022-09-22 LAB — COMPREHENSIVE METABOLIC PANEL
ALT: 25 U/L (ref 0–44)
ALT: 23 U/L (ref 0–44)
AST: 18 U/L (ref 15–41)
AST: 19 U/L (ref 15–41)
Albumin: 1.8 g/dL — ABNORMAL LOW (ref 3.5–5.0)
Albumin: 1.7 g/dL — ABNORMAL LOW (ref 3.5–5.0)
Alkaline Phosphatase: 61 U/L (ref 38–126)
Alkaline Phosphatase: 61 U/L (ref 38–126)
Anion gap: 8 (ref 5–15)
Anion gap: 8 (ref 5–15)
BUN: 49 mg/dL — ABNORMAL HIGH (ref 8–23)
BUN: 47 mg/dL — ABNORMAL HIGH (ref 8–23)
CO2: 24 mmol/L (ref 22–32)
CO2: 23 mmol/L (ref 22–32)
Calcium: 8.9 mg/dL (ref 8.9–10.3)
Calcium: 8.5 mg/dL — ABNORMAL LOW (ref 8.9–10.3)
Chloride: 104 mmol/L (ref 98–111)
Chloride: 109 mmol/L (ref 98–111)
Creatinine, Ser: 1.11 mg/dL (ref 0.61–1.24)
Creatinine, Ser: 1.05 mg/dL (ref 0.61–1.24)
GFR, Estimated: >60 mL/min (ref >60)
GFR, Estimated: >60 mL/min (ref >60)
Glucose, Bld: 130 mg/dL — ABNORMAL HIGH (ref 70–99)
Glucose, Bld: 104 mg/dL — ABNORMAL HIGH (ref 70–99)
Potassium: 4.9 mmol/L (ref 3.5–5.1)
Potassium: 5 mmol/L (ref 3.5–5.1)
Sodium: 136 mmol/L (ref 135–145)
Sodium: 140 mmol/L (ref 135–145)
Total Bilirubin: 0.5 mg/dL (ref 0.3–1.2)
Total Bilirubin: 0.6 mg/dL (ref 0.3–1.2)
Total Protein: 5.2 g/dL — ABNORMAL LOW (ref 6.5–8.1)
Total Protein: 4.9 g/dL — ABNORMAL LOW (ref 6.5–8.1)

## 2022-09-22 LAB — BODY FLUID CELL COUNT WITH DIFFERENTIAL: Total Nucleated Cell Count, Fluid: 27060 cu mm — ABNORMAL HIGH (ref 0–1000)

## 2022-09-22 LAB — STREP PNEUMONIAE URINARY ANTIGEN: Strep Pneumo Urinary Antigen: NEGATIVE

## 2022-09-22 LAB — EXPECTORATED SPUTUM ASSESSMENT W GRAM STAIN, RFLX TO RESP C

## 2022-09-22 LAB — MRSA NEXT GEN BY PCR, NASAL: MRSA by PCR Next Gen: NOT DETECTED

## 2022-09-22 LAB — PROTIME-INR
INR: 1.4 — ABNORMAL HIGH (ref 0.8–1.2)
Prothrombin Time: 17.3 seconds — ABNORMAL HIGH (ref 11.4–15.2)

## 2022-09-22 LAB — PROCALCITONIN: Procalcitonin: 0.61 ng/mL

## 2022-09-22 LAB — LACTATE DEHYDROGENASE: LDH: 88 U/L — ABNORMAL LOW (ref 98–192)

## 2022-09-22 LAB — MAGNESIUM: Magnesium: 1.7 mg/dL (ref 1.7–2.4)

## 2022-09-22 MED ORDER — NALOXONE HCL 0.4 MG/ML IJ SOLN
INTRAMUSCULAR | Status: AC
  Filled 2022-09-22: qty 1

## 2022-09-22 MED ORDER — FLUMAZENIL 0.5 MG/5ML IV SOLN
INTRAVENOUS | Status: AC
  Filled 2022-09-22: qty 5

## 2022-09-22 MED ORDER — MIDAZOLAM HCL 2 MG/2ML IJ SOLN
INTRAMUSCULAR | Status: AC | PRN
  Administered 2022-09-22 (×2): .5 mg via INTRAVENOUS

## 2022-09-22 MED ORDER — MIDAZOLAM HCL 2 MG/2ML IJ SOLN
INTRAMUSCULAR | Status: AC
  Filled 2022-09-22: qty 4

## 2022-09-22 MED ORDER — HYDROCODONE-ACETAMINOPHEN 5-325 MG PO TABS
1.0000 | ORAL_TABLET | Freq: Four times a day (QID) | ORAL | Status: DC | PRN
  Administered 2022-09-24 – 2022-09-26 (×6): 1 via ORAL
  Filled 2022-09-22 (×7): qty 1

## 2022-09-22 MED ORDER — ORAL CARE MOUTH RINSE: 15.0000 mL | OROMUCOSAL | Status: DC | PRN

## 2022-09-22 MED ORDER — LIDOCAINE HCL (PF) 1 % IJ SOLN
INTRAMUSCULAR | Status: AC | PRN
  Administered 2022-09-22: 10 mL

## 2022-09-22 MED ORDER — FENTANYL CITRATE PF 50 MCG/ML IJ SOSY
12.5000 ug | PREFILLED_SYRINGE | INTRAMUSCULAR | Status: DC | PRN
  Administered 2022-09-22 – 2022-09-28 (×12): 12.5 ug via INTRAVENOUS
  Filled 2022-09-22 (×12): qty 1

## 2022-09-22 MED ORDER — NOREPINEPHRINE 4 MG/250ML-% IV SOLN: 2.0000 ug/min | INTRAVENOUS | Status: DC

## 2022-09-22 MED ORDER — FENTANYL CITRATE (PF) 100 MCG/2ML IJ SOLN
INTRAMUSCULAR | Status: AC
  Filled 2022-09-22: qty 4

## 2022-09-22 MED ORDER — FENTANYL CITRATE (PF) 100 MCG/2ML IJ SOLN
INTRAMUSCULAR | Status: AC | PRN
  Administered 2022-09-22 (×2): 25 ug via INTRAVENOUS

## 2022-09-22 MED ORDER — MAGNESIUM SULFATE 2 GM/50ML IV SOLN
2.0000 g | Freq: Once | INTRAVENOUS | Status: AC
  Administered 2022-09-22: 2 g via INTRAVENOUS
  Filled 2022-09-22: qty 50

## 2022-09-22 MED ORDER — AMIODARONE HCL IN DEXTROSE 360-4.14 MG/200ML-% IV SOLN
30.0000 mg/h | INTRAVENOUS | Status: DC
  Administered 2022-09-23 (×3): 30 mg/h via INTRAVENOUS
  Filled 2022-09-22 (×2): qty 200

## 2022-09-22 MED ORDER — AMIODARONE HCL IN DEXTROSE 360-4.14 MG/200ML-% IV SOLN
60.0000 mg/h | INTRAVENOUS | Status: AC
  Administered 2022-09-22 – 2022-09-23 (×2): 60 mg/h via INTRAVENOUS
  Filled 2022-09-22 (×2): qty 200

## 2022-09-22 MED ORDER — SODIUM CHLORIDE 0.9 % IV SOLN: INTRAVENOUS | Status: DC

## 2022-09-22 MED ORDER — AMIODARONE IV BOLUS ONLY 150 MG/100ML
INTRAVENOUS | Status: AC
  Filled 2022-09-22: qty 100

## 2022-09-22 MED ORDER — SODIUM CHLORIDE 0.9 % IV BOLUS
1000.0000 mL | Freq: Once | INTRAVENOUS | Status: AC
  Administered 2022-09-22: 1000 mL via INTRAVENOUS

## 2022-09-22 MED ORDER — CHLORHEXIDINE GLUCONATE CLOTH 2 % EX PADS
6.0000 | MEDICATED_PAD | Freq: Every day | CUTANEOUS | Status: DC
  Administered 2022-09-22 – 2022-09-26 (×4): 6 via TOPICAL

## 2022-09-22 MED ORDER — AMIODARONE LOAD VIA INFUSION
150.0000 mg | Freq: Once | INTRAVENOUS | Status: AC
  Administered 2022-09-22: 150 mg via INTRAVENOUS
  Filled 2022-09-22: qty 83.34

## 2022-09-22 MED ORDER — NOREPINEPHRINE 4 MG/250ML-% IV SOLN
INTRAVENOUS | Status: AC
  Administered 2022-09-22: 2 ug/min via INTRAVENOUS
  Filled 2022-09-22: qty 250

## 2022-09-22 MED ORDER — SODIUM CHLORIDE 0.9% FLUSH
10.0000 mL | Freq: Three times a day (TID) | INTRAVENOUS | Status: DC
  Administered 2022-09-22 – 2022-09-27 (×11): 10 mL via INTRAPLEURAL

## 2022-09-22 MED ORDER — SODIUM CHLORIDE 0.9 % IV SOLN
250.0000 mL | INTRAVENOUS | Status: DC
  Administered 2022-09-22: 250 mL via INTRAVENOUS

## 2022-09-22 NOTE — Consult Note (Signed)
Chief Complaint: Patient was seen in consultation today for left loculated pleural effusion  at the request of Zada Finders, MD  Referring Physician(s): Zada Finders, MD  Supervising Physician: Jacqulynn Cadet  Patient Status: [facility] - In-pt  History of Present Illness: Ryan Reynolds is a 87 y.o. male with PMH significant for tobacco use who presented to the ED 09/22/2022 complaining of cough and fatigue.  Patient complains of fatigue and lightheadedness that started approximately 2 weeks ago with progressive weakness and productive cough. CT chest showed large loculated left pleural effusion suspicious for empyema with malignant effusion not excluded.  Patient was referred to IR for evaluation for treatment.  Dr. Laurence Ferrari reviewed imaging and recommends possible multiple chest tube placements for left loculated pleural effusion.  Pr denies chills, CP, SOB, N/V or leg swelling.  He endorses loss of appetite, fatigue, fever, weakness, chest pain, abd pain, dizziness, HA, weakness and several falls over the past 2 weeks.  He is NPO per order.   Past Surgical History:  Procedure Laterality Date   CATARACT EXTRACTION, BILATERAL Bilateral 2016   TONSILLECTOMY      Allergies: Patient has no known allergies.  Medications: Prior to Admission medications   Medication Sig Start Date End Date Taking? Authorizing Provider  amoxicillin-clavulanate (AUGMENTIN) 875-125 MG tablet Take 1 tablet by mouth every 12 (twelve) hours. 06/03/19   Ok Edwards, PA-C     Family History  Problem Relation Age of Onset   Stroke Mother    Stroke Father     Social History   Socioeconomic History   Marital status: Married    Spouse name: Izora Gala    Number of children: 1   Years of education: 2.5 year undergrad   Highest education level: Not on file  Occupational History   Occupation: Inventory control - [employer]    Comment: Works 2 days a week  Tobacco Use   Smoking status: Every Day     Packs/day: 1.00    Types: Cigarettes   Smokeless tobacco: Never  Vaping Use   Vaping Use: Never used  Substance and Sexual Activity   Alcohol use: No   Drug use: No   Sexual activity: Not on file  Other Topics Concern   Not on file  Social History Narrative   Lives at home with wife. No steps to front porch of 1 story home. 1 step to back porch.    Social Determinants of Health   Financial Resource Strain: Not on file  Food Insecurity: Not on file  Transportation Needs: Not on file  Physical Activity: Not on file  Stress: Not on file  Social Connections: Not on file     Review of Systems: A 12 point ROS discussed and pertinent positives are indicated in the HPI above.  All other systems are negative.  Review of Systems  Constitutional:  Positive for appetite change, fatigue and fever.  Respiratory:  Positive for cough. Negative for shortness of breath.   Cardiovascular:  Positive for chest pain. Negative for leg swelling.  Gastrointestinal:  Positive for abdominal pain. Negative for nausea and vomiting.  Neurological:  Positive for dizziness, weakness, light-headedness and headaches.    Vital Signs: BP 107/74 (BP Location: Right Arm)   Pulse (!) 108   Temp 98.6 F (37 C) (Oral)   Resp (!) 35   Ht 6\' 1"  (1.854 m)   Wt 180 lb 12.4 oz (82 kg)   SpO2 94%   BMI 23.85 kg/m  Physical Exam Vitals reviewed.  Constitutional:      General: He is not in acute distress.    Appearance: He is ill-appearing.  HENT:     Head: Normocephalic and atraumatic.     Mouth/Throat:     Mouth: Mucous membranes are dry.     Pharynx: Oropharynx is clear.  Eyes:     Extraocular Movements: Extraocular movements intact.     Pupils: Pupils are equal, round, and reactive to light.  Cardiovascular:     Rate and Rhythm: Regular rhythm. Tachycardia present.     Pulses: Normal pulses.     Heart sounds: Normal heart sounds. No murmur heard. Pulmonary:     Effort: Pulmonary effort is  normal. No respiratory distress.     Breath sounds: Rhonchi present.     Comments: Rhonchi throughout Diminished LUL/LLL Abdominal:     General: Bowel sounds are normal. There is no distension.     Palpations: Abdomen is soft.     Tenderness: There is no abdominal tenderness. There is no guarding.  Musculoskeletal:     Right lower leg: No edema.     Left lower leg: No edema.  Skin:    General: Skin is warm and dry.  Neurological:     Mental Status: He is alert and oriented to person, place, and time.  Psychiatric:        Mood and Affect: Mood normal.        Behavior: Behavior normal.        Thought Content: Thought content normal.        Judgment: Judgment normal.     Imaging: CT CHEST WO CONTRAST  Result Date: 2022/10/02 CLINICAL DATA:  Pneumonia complication suspected. X-ray done showing pleural effusion. Malignancy suspected. Congestion and cough EXAM: CT CHEST WITHOUT CONTRAST TECHNIQUE: Multidetector CT imaging of the chest was performed following the standard protocol without IV contrast. RADIATION DOSE REDUCTION: This exam was performed according to the departmental dose-optimization program which includes automated exposure control, adjustment of the mA and/or kV according to patient size and/or use of iterative reconstruction technique. COMPARISON:  Radiographs earlier today FINDINGS: Cardiovascular: Normal heart size. Small pericardial effusion. Aortic valve and mitral annular calcification. Coronary artery and aortic atherosclerotic calcification. Mediastinum/Nodes: 1.9 cm left thyroid nodule. Unremarkable esophagus. No definite thoracic adenopathy on noncontrast exam. Lungs/Pleura: Layering secretions in the trachea extending into the left mainstem bronchus where there is near occlusion. Mucous plugging throughout the left lingular and lower lobe bronchi. Mild diffuse bronchial wall thickening. Large loculated pleural effusion on the left. There is suggestion of wall thickening  about the loculated effusions compatible with empyemas though this is incompletely evaluated without IV contrast. Near-complete collapse of both the left upper and lower lobes. Right posterior lower atelectasis/scarring and pleural thickening. Emphysema. Upper Abdomen: Poorly evaluated.  No definite acute abnormality. Musculoskeletal: No acute osseous abnormality. IMPRESSION: Large loculated left pleural effusion. This is suspicious for empyemas though is incompletely evaluated without IV contrast. Malignant effusion is not excluded. Large amount of secretions within the trachea and left lung bronchi with postobstructive atelectasis/pneumonia. Coronary artery atherosclerosis. Aortic Atherosclerosis (ICD10-I70.0) and Emphysema (ICD10-J43.9). 1.9 cm left thyroid nodule. Nonemergent ultrasound could be performed if clinically indicated given comorbidities and life expectancy. Electronically Signed   By: Minerva Fester M.D.   On: 2022/10/02 20:36   DG Chest Port 1 View  Result Date: 10/02/2022 CLINICAL DATA:  New onset AFib EXAM: PORTABLE CHEST 1 VIEW COMPARISON:  None. FINDINGS: Cardiac silhouette appears enlarged.  There is complete opacification of the left hemithorax. Right lung is clear. No pneumothorax. No acute fractures. IMPRESSION: Complete opacification of the left hemithorax, likely combination of pleural effusion and atelectasis/consolidation. Electronically Signed   By: Darliss Cheney M.D.   On: 10/02/2022 18:36    Labs:  CBC: Recent Labs    10/03/2022 1752 09/22/22 0430  WBC 25.5* 26.7*  HGB 11.9* 10.9*  HCT 37.5* 35.1*  PLT 510* 412*    COAGS: No results for input(s): "INR", "APTT" in the last 8760 hours.  BMP: Recent Labs    10/02/2022 1752 09/22/22 0430  NA 134* 136  K 5.1 4.9  CL 101 104  CO2 24 24  GLUCOSE 188* 130*  BUN 50* 49*  CALCIUM 9.6 8.9  CREATININE 1.38* 1.11  GFRNONAA 50* >60    LIVER FUNCTION TESTS: Recent Labs    10/12/2022 1752 09/22/22 0430  BILITOT 0.6  0.5  AST 23 18  ALT 33 25  ALKPHOS 71 61  PROT 6.1* 5.2*  ALBUMIN 2.0* 1.8*    TUMOR MARKERS: No results for input(s): "AFPTM", "CEA", "CA199", "CHROMGRNA" in the last 8760 hours.  Assessment and Plan: 87 year old male presents to IR for chest tube placement due to large left loculated pleural effusion.  Pt resting on stretcher.  He is A&O, calm and pleasant.  He is in no distress.   Risks and benefits of chest tube placement were discussed with the patient including bleeding, infection, damage to adjacent structures, malfunction of the tube requiring additional procedures and sepsis.  All of the patient's questions were answered, patient is agreeable to proceed. Consent signed and in chart.  Thank you for this interesting consult.  I greatly enjoyed meeting DECKER COGDELL and look forward to participating in their care.  A copy of this report was sent to the requesting provider on this date.  Electronically Signed: Shon Hough, NP 09/22/2022, 9:06 AM   I spent a total of 20 minutes in face to face in clinical consultation, greater than 50% of which was counseling/coordinating care for large left loculated pleural effusion.

## 2022-09-22 NOTE — Procedures (Signed)
Interventional Radiology Procedure Note  Procedure: Placement of 3 chest tubes into the large complex left empyema with removal of over 1.5L of foul smelling purulent fluid.   Complications: None  Estimated Blood Loss: None  Recommendations: - Cultures sent - All 3 chest tubes to LWS at -20 cm H2O  Signed,  Criselda Peaches, MD

## 2022-09-22 NOTE — ED Notes (Addendum)
Congested noted in throat. Resting comfortably, intermittent throat gurgling noted. Abundance of secretions in throat that he is not able to cough up. No change in LS. SPO2 93-94% on 2L [place] repositioned in nares. RT and admitting updated. Alert, NAD, calm, interactive, speaking on phone. VSS.

## 2022-09-22 NOTE — Progress Notes (Signed)
PROGRESS NOTE    Ryan Reynolds  ZOX:[id number] DOB: 1936-08-31 DOA: September 27, 2022 PCP: Patient, No Pcp Per    Brief Narrative:  87 year old gentleman with history of previous smoking presented with more than 2 weeks of cough and fatigue, 50 pound weight loss for last 1 year.  Progressive weakness.  Patient does have a history of more than 60 pack/year of smoking.  He just quit 2 months ago.  In the emergency room blood pressures initially low responded to IV fluids.  Heart rate 114.  Temperature 100.9.  93% on room air.  WC count 25.5.  Magnesium was 1.3.  Chest x-ray showed complete opacification of the left hemithorax, CT scan of the chest showed large loculated left pleural effusion.  Case was discussed with pulmonary, recommended IR for diagnostic and therapeutic chest tube placement.   Assessment & Plan:   Sepsis present on admission /loculated left pleural effusion, suspected empyema with postobstructive atelectasis: Sepsis present on admission due to leukocytosis, fever, tachypnea and tachycardia. Agree with admission given severity of symptoms. Antibiotics to treat bacterial pneumonia, continue Rocephin and azithromycin. Chest physiotherapy, incentive spirometry, deep breathing exercises, sputum induction, mucolytic's and bronchodilators. Sputum cultures, blood cultures, Legionella and streptococcal antigen. Supplemental oxygen to keep saturations more than 90%. IR consulted for chest tube placement, fluid analysis and drainage.  Hypomagnesemia: Replaced and adequate.  Thyroid nodule: Will need outpatient follow-up.     DVT prophylaxis: SCDs Start: 09/27/2022 2316   Code Status: DNR Family Communication: None at the bedside Disposition Plan: Status is: Inpatient Remains inpatient appropriate because: Inpatient procedures, IV antibiotics     Consultants:  Interventional radiology  Procedures:  None  Antimicrobials:  Rocephin and azithromycin  09/27/22---   Subjective: Patient seen in the morning rounds.  He was still in the emergency room.  Nursing staff noted lot of guarding and coughing.  Patient is alert awake and oriented.  Denied any shortness of breath.  Afebrile overnight.  Objective: Vitals:   09/22/22 0900 09/22/22 0930 09/22/22 1000 09/22/22 1141  BP: 92/73 105/76 101/73   Pulse: (!) 110 (!) 112 (!) 104   Resp: (!) 35 (!) 36 (!) 32   Temp:    98.1 F (36.7 C)  TempSrc:    Oral  SpO2: 95% 95% 95%   Weight:      Height:        Intake/Output Summary (Last 24 hours) at 09/22/2022 1152 Last data filed at 09/22/2022 8119 Gross per 24 hour  Intake 525 ml  Output 350 ml  Net 175 ml   Filed Weights   2022-09-27 1733  Weight: 82 kg    Examination:  General exam: Appears calm and comfortable on room air. Thin and frail.  Debilitated. Respiratory system: Bronchial breath sounds on the left side.  Poor air entry.  No added sounds. Oropharynx with some exudates, no erythema, no patches. SpO2: 95 % O2 Flow Rate (L/min): 2 L/min FiO2 (%): 100 %  Cardiovascular system: S1 & S2 heard, RRR.No pedal edema. Gastrointestinal system: Abdomen is nondistended, soft and nontender. No organomegaly or masses felt. Normal bowel sounds heard. Central nervous system: Alert and oriented. No focal neurological deficits. Extremities: Symmetric 5 x 5 power. Skin: No rashes, lesions or ulcers Psychiatry: Judgement and insight appear normal. Mood & affect appropriate.     Data Reviewed: I have personally reviewed following labs and imaging studies  CBC: Recent Labs  Lab 2022/09/27 1752 09/22/22 0430  WBC 25.5* 26.7*  HGB 11.9* 10.9*  HCT 37.5* 35.1*  MCV 90.8 92.1  PLT 510* 123456*   Basic Metabolic Panel: Recent Labs  Lab 10/08/2022 1752 09/22/22 0430  NA 134* 136  K 5.1 4.9  CL 101 104  CO2 24 24  GLUCOSE 188* 130*  BUN 50* 49*  CREATININE 1.38* 1.11  CALCIUM 9.6 8.9  MG 1.3* 1.7   GFR: Estimated Creatinine Clearance:  56 mL/min (by C-G formula based on SCr of 1.11 mg/dL). Liver Function Tests: Recent Labs  Lab 09/27/2022 1752 09/22/22 0430  AST 23 18  ALT 33 25  ALKPHOS 71 61  BILITOT 0.6 0.5  PROT 6.1* 5.2*  ALBUMIN 2.0* 1.8*   No results for input(s): "LIPASE", "AMYLASE" in the last 168 hours. No results for input(s): "AMMONIA" in the last 168 hours. Coagulation Profile: Recent Labs  Lab 09/22/22 0900  INR 1.4*   Cardiac Enzymes: No results for input(s): "CKTOTAL", "CKMB", "CKMBINDEX", "TROPONINI" in the last 168 hours. BNP (last 3 results) No results for input(s): "PROBNP" in the last 8760 hours. HbA1C: No results for input(s): "HGBA1C" in the last 72 hours. CBG: No results for input(s): "GLUCAP" in the last 168 hours. Lipid Profile: No results for input(s): "CHOL", "HDL", "LDLCALC", "TRIG", "CHOLHDL", "LDLDIRECT" in the last 72 hours. Thyroid Function Tests: Recent Labs    09/30/2022 1753  TSH 1.135   Anemia Panel: No results for input(s): "VITAMINB12", "FOLATE", "FERRITIN", "TIBC", "IRON", "RETICCTPCT" in the last 72 hours. Sepsis Labs: Recent Labs  Lab 10/02/2022 2000 09/22/22 0430  PROCALCITON  --  0.61  LATICACIDVEN 1.9  --     Recent Results (from the past 240 hour(s))  Culture, blood (single)     Status: None (Preliminary result)   Collection Time: 10/01/2022  5:54 PM   Specimen: BLOOD  Result Value Ref Range Status   Specimen Description   Final    BLOOD BLOOD LEFT ARM Performed at [facility] [address], [place], [place] [address]    Special Requests   Final    BOTTLES DRAWN AEROBIC AND ANAEROBIC Blood Culture results may not be optimal due to an inadequate volume of blood received in culture bottles Performed at [facility] [address], [place], [place] [address]    Culture   Final    NO GROWTH < 12 HOURS Performed at [facility] [address], [place], [place] [address]    Report Status PENDING   Incomplete         Radiology Studies: CT CHEST WO CONTRAST  Result Date: 10/13/2022 CLINICAL DATA:  Pneumonia complication suspected. X-ray done showing pleural effusion. Malignancy suspected. Congestion and cough EXAM: CT CHEST WITHOUT CONTRAST TECHNIQUE: Multidetector CT imaging of the chest was performed following the standard protocol without IV contrast. RADIATION DOSE REDUCTION: This exam was performed according to the departmental dose-optimization program which includes automated exposure control, adjustment of the mA and/or kV according to patient size and/or use of iterative reconstruction technique. COMPARISON:  Radiographs earlier today FINDINGS: Cardiovascular: Normal heart size. Small pericardial effusion. Aortic valve and mitral annular calcification. Coronary artery and aortic atherosclerotic calcification. Mediastinum/Nodes: 1.9 cm left thyroid nodule. Unremarkable esophagus. No definite thoracic adenopathy on noncontrast exam. Lungs/Pleura: Layering secretions in the trachea extending into the left mainstem bronchus where there is near occlusion. Mucous plugging throughout the left lingular and lower lobe bronchi. Mild diffuse bronchial wall thickening. Large loculated pleural effusion on the left. There is suggestion of wall thickening about the loculated effusions  compatible with empyemas though this is incompletely evaluated without IV contrast. Near-complete collapse of both the left upper and lower lobes. Right posterior lower atelectasis/scarring and pleural thickening. Emphysema. Upper Abdomen: Poorly evaluated.  No definite acute abnormality. Musculoskeletal: No acute osseous abnormality. IMPRESSION: Large loculated left pleural effusion. This is suspicious for empyemas though is incompletely evaluated without IV contrast. Malignant effusion is not excluded. Large amount of secretions within the trachea and left lung bronchi with postobstructive atelectasis/pneumonia. Coronary  artery atherosclerosis. Aortic Atherosclerosis (ICD10-I70.0) and Emphysema (ICD10-J43.9). 1.9 cm left thyroid nodule. Nonemergent ultrasound could be performed if clinically indicated given comorbidities and life expectancy. Electronically Signed   By: Minerva Fester M.D.   On: 09/15/2022 20:36   DG Chest Port 1 View  Result Date: 09/15/2022 CLINICAL DATA:  New onset AFib EXAM: PORTABLE CHEST 1 VIEW COMPARISON:  None. FINDINGS: Cardiac silhouette appears enlarged. There is complete opacification of the left hemithorax. Right lung is clear. No pneumothorax. No acute fractures. IMPRESSION: Complete opacification of the left hemithorax, likely combination of pleural effusion and atelectasis/consolidation. Electronically Signed   By: Darliss Cheney M.D.   On: 09/19/2022 18:36        Scheduled Meds:  sodium chloride flush  3 mL Intravenous Q12H   Continuous Infusions:  azithromycin Stopped (10/14/2022 2010)   cefTRIAXone (ROCEPHIN)  IV Stopped (09/18/2022 1916)     LOS: 1 day    Time spent: 35 minutes    Dorcas Carrow, MD [facility] Pager [phone]

## 2022-09-22 NOTE — Progress Notes (Signed)
An USGPIV (ultrasound guided PIV) has been placed for short-term vasopressor infusion. A correctly placed ivWatch must be used when administering Vasopressors. Should this treatment be needed beyond 72 hours, central line access should be obtained.  It will be the responsibility of the bedside nurse to follow best practice to prevent extravasations.   ?

## 2022-09-22 NOTE — Progress Notes (Signed)
Patient brought to room 1226 via gurney by IR nurse. Patient had three chest tubes placed on the left side that had already had greater than 2000cc out prior to coming upstairs. Once patient was settled it became clear that all chest tubes would need to be  re-dressed due to bulky dressings not allowing for easy access,  visualization of tubes and drainage had stopped completely despite giving ordered flushes. Patient was repositioned so that I could assess his chest tube located on the mid left upper back and during this time the patient went into a flutter according to monitor highest rate recorded was 204. Hospitalist notified and asked to obtain EKG, responded quickly to assess patient in person. I requested fluids due to patient already soft pressures, and copious continued output. Bolus ordered and given, continuous fluids ordered and given. Patient is very fidgety and anxious. He initially said he did not need pain medication but within an hour was saying he was in an 8/10 pain, meds requested and ordered. I requested labs be re-drawn, mag replacement ordered. I requested ccm consult on this patient due to impending decline- ccm providers meier/hoffman assessed patient and chart at bedside, reached out to hospitalist. PT bp began decreasing more rapidly at shift change. CCM reconsulted.

## 2022-09-22 NOTE — Progress Notes (Addendum)
ICU Consult    NAME:  Ryan Reynolds, MRN:  [id number], DOB:  1936/08/23, LOS: 1 ADMISSION DATE:  10/04/22, CONSULTATION DATE:  09/22/2022 REFERRING NP:  Clarene Essex ACNP  Reason for Consult:  Hypotension requiring pressors  Brief History   87 year old male patient with history of previous smoking admitted through ER with greater than 2 weeks of cough and fatigue, 50 pound weight loss for 1 year. Progressively weakened. In the emergency room hypotensive initially responding to IV fluids.   Chest x-ray showed complete opacification of left hemithorax, CT scan of the chest showed large loculated left pleural effusion.   Significant Hospital Events   Patient received 3 chest tubes placed on the left side. Amiodarone ordered for flutter at heart rate of 140-190 initially.   1 L of IV fluid completed prior to shift change at 1900 hrs. Second liter of fluid completed. Blood pressures into the 02R systolic in spite of bolus. Pressor started with response to 90 SBP.  Response to pressor into the 100s sbp Likely fluid shift/dehydration   Patient is awake and oriented in no obvious or stated distress. Chest x-ray obtained Labs drawn      Currently 2.1 L of purulent drainage total of (3 collection devices)  Consults:  CCM - Dr Ruthann Cancer  Procedures:  IR placed 3 chest tubes   Significant Diagnostic Tests:  Radiology Body fluid culture  Fungal culture   Micro Data:    Latest Reference Range & Units 09/22/22 23:10  RESP PANEL BY RT-PCR (RSV, FLU A&B, COVID)  [id number]  Rpt  Influenza A By PCR NEGATIVE  NEGATIVE  Influenza B By PCR NEGATIVE  NEGATIVE  Respiratory Syncytial Virus by PCR NEGATIVE  NEGATIVE  SARS Coronavirus 2 by RT PCR NEGATIVE   NEGATIVE  Rpt: View report in Results Review for more information   Others- pending  Antimicrobials:  Zithromax  500 mg QD Rocephin 2g QD    Objective   Blood pressure 103/68, pulse 89, temperature (!) 97.4 F (36.3 C), temperature source Axillary, resp. rate (!) 22, height 6' (1.829 m), weight 82 kg, SpO2 94 %.    FiO2 (%):  [  100 %] 100 %   Intake/Output Summary (Last 24 hours) at 09/22/2022 2304 Last data filed at 09/22/2022 2230 Gross per 24 hour  Intake 3426.41 ml  Output 3445 ml  Net -18.59 ml   Filed Weights   05-Oct-2022 1733  Weight: 82 kg    Examination: General: alert/oriented x3 Lungs: CTAB, normal work of breathing Cardiovascular:  A- flutter  Abdomen: soft, nontender, normal bowel sounds Extremities: Initially pale - currently warm, well-perfused without cyanosis, edema Neuro: grossly nonfocal, follows commands  Resolved Hospital Problem list   Loculations - in progress - Chest tubes Hypotension - in progress - pressor   Assessment & Plan:  Continue current treatment with fluid  Continue pressor- titrate Monitor Chest tubes(s) output and color consistency Pulmonary hygiene Titrate O2 as possible   Best practice:  Diet: Regular diet as tolerated Pain/Anxiety/Delirium protocol (if indicated): continue as ordered prior Mobility: as tolerated Pulmonary hygiene Code Status: DNR  Disposition: ICU status for now  Labs   CBC: Recent Labs  Lab 10/05/22 1752 09/22/22 0430 09/22/22 2137  WBC 25.5* 26.7* 28.7*  HGB 11.9* 10.9* 10.6*  HCT 37.5* 35.1* 36.7*  MCV 90.8 92.1 99.7  PLT 510* 412* 430*    Basic Metabolic Panel: Recent Labs  Lab 2022-10-05 1752 09/22/22 0430 09/22/22 2137  NA 134* 136 140  K 5.1 4.9 5.0  CL 101 104 109  CO2 24 24 23   GLUCOSE 188* 130* 104*  BUN 50* 49* 47*  CREATININE 1.38* 1.11 1.05  CALCIUM 9.6 8.9 8.5*  MG 1.3* 1.7  --    GFR: Estimated Creatinine Clearance: 57.5 mL/min (by C-G formula based on SCr of 1.05  mg/dL). Recent Labs  Lab Oct 05, 2022 1752 October 05, 2022 2000 09/22/22 0430 09/22/22 2137  PROCALCITON  --   --  0.61  --   WBC 25.5*  --  26.7* 28.7*  LATICACIDVEN  --  1.9  --   --     Liver Function Tests: Recent Labs  Lab 2022/10/05 1752 09/22/22 0430 09/22/22 2137  AST 23 18 19   ALT 33 25 23  ALKPHOS 71 61 61  BILITOT 0.6 0.5 0.6  PROT 6.1* 5.2* 4.9*  ALBUMIN 2.0* 1.8* 1.7*   No results for input(s): "LIPASE", "AMYLASE" in the last 168 hours. No results for input(s): "AMMONIA" in the last 168 hours.  ABG No results found for: "PHART", "PCO2ART", "PO2ART", "HCO3", "TCO2", "ACIDBASEDEF", "O2SAT"   Coagulation Profile: Recent Labs  Lab 09/22/22 0900  INR 1.4*    Cardiac Enzymes: No results for input(s): "CKTOTAL", "CKMB", "CKMBINDEX", "TROPONINI" in the last 168 hours.  HbA1C: No results found for: "HGBA1C"  CBG: No results for input(s): "GLUCAP" in the last 168 hours.   Past Medical History  He,  has no past medical history on file.   Surgical History    Past Surgical History:  Procedure Laterality Date   CATARACT EXTRACTION, BILATERAL Bilateral 2016   TONSILLECTOMY       Social History   reports that he has been smoking cigarettes. He has been smoking an average of 1 pack per day. He has never used smokeless tobacco. He reports that he does not drink alcohol and does not use drugs.   Family History   His family history includes Stroke in his father and mother.   Allergies No Known Allergies   Home Medications  Prior to Admission medications   Medication Sig Start Date End Date Taking? Authorizing Provider  amoxicillin-clavulanate (AUGMENTIN) 875-125 MG tablet Take 1  tablet by mouth every 12 (twelve) hours. 06/03/19   Belinda Fisher, PA-C         Imaging In part:   "IMPRESSION: 1. Significant interval improvement in left lung aeration after placement of 3 left hemithorax chest tubes. 2. Small left inferior pneumothorax. 3. New opacity at the right  lung base suspected to be a small effusion with atelectasis.     Electronically Signed   By: Amie Portland M.D.   On: 09/22/2022 21:16"  -------------------------------------------------------------------------------------------- Lab draw:   Latest Reference Range & Units 09/22/22 21:37  Sodium 135 - 145 mmol/L 140  Potassium 3.5 - 5.1 mmol/L 5.0  Chloride 98 - 111 mmol/L 109  CO2 22 - 32 mmol/L 23  Glucose 70 - 99 mg/dL 468 (H)  BUN 8 - 23 mg/dL 47 (H)  Creatinine 0.32 - 1.24 mg/dL 1.22  Calcium 8.9 - 48.2 mg/dL 8.5 (L)  Anion gap 5 - 15  8  Alkaline Phosphatase 38 - 126 U/L 61  Albumin 3.5 - 5.0 g/dL 1.7 (L)  AST 15 - 41 U/L 19  ALT 0 - 44 U/L 23  Total Protein 6.5 - 8.1 g/dL 4.9 (L)  Total Bilirubin 0.3 - 1.2 mg/dL 0.6  GFR, Estimated >50 mL/min >60  (H): Data is abnormally high (L): Data is abnormally low   Currently at 2317 hrs 09/22/2022  Levophed  - 9 mcg/ min Amiodarone - 60 mg/hr (bolus completed/protocol start) Normal Saline : 125 mL/hr    Chinita Greenland MSNA MSN ACNPC-AG Acute Care Nurse Practitioner [facility]

## 2022-09-22 NOTE — Significant Event (Signed)
Patient went to interventional radiology and had 3 chest tube placed on the left side, drained more than 1.5 L of purulent empyema.  Seen and examined after the procedure.  Patient feels comfortable and even does not want to use fentanyl for pain.  I was called at the bedside because patient is having a flutter with heart rate ranging from 140-190.  Patient denies any chest pain palpitations or dizziness.  Blood pressures has remained low normal but adequate.  Patient without history of previous arrhythmias/a flutter.  Plan: Patient has low normal blood pressure and just underwent massive pleural drainage.  Risk of hypotension.  Due to persistent elevated heart rate, will start patient on amiodarone bolus and maintenance infusion. 1 L normal saline bolus.  Continue maintenance IV fluid. Magnesium was replaced in the morning, will give 2 g magnesium repeat.  Recheck electrolytes in the morning. Check TSH.  Will check echocardiogram. This is probably transient episode, will hold off on anticoagulation at this time.

## 2022-09-22 NOTE — ED Notes (Signed)
Flutter encouraged/ pt demonstrated 10x. IS encouraged/ pt demonstrated 4x. Encouraged sputum sample.

## 2022-09-22 NOTE — Progress Notes (Addendum)
       Overnight   NAME: BENEDETTO RYDER MRN: [id number] DOB : [birth year]    Date of Service   09/22/2022   HPI/Events of Note   Notified by RN for Hypotension and volume of drainage from chest tube(s).  87 year old male patient with history of previous smoking admitted through ER with greater than 2 weeks of cough and fatigue, 50 pound weight loss for 1 year. Progressively weakened. In the emergency room hypotensive initially responding to IV fluids.  Chest x-ray showed complete opacification of left hemithorax, CT scan of the chest showed large loculated left pleural effusion. Patient received 3 chest tubes placed on the left side. Amiodarone ordered for flutter at heart rate of 140-190 initially.  1 L of IV fluid completed prior to shift change at 1900 hrs. Second liter of fluid infusing now. Blood pressures into the 35T systolic in spite of bolus. Pressor started with response to 90 SBP.  Patient is awake and oriented in no obvious or stated distress. Chest x-ray obtained Labs drawn    Currently 2.1 L of purulent drainage total in (3 collection devices)   Interventions/ Plan   Complete IVF bolus Labs drawn- pending CXR - pending Levophed titrated for MAP 65 IV Team consult       Gershon Cull BSN MSNA MSN ACNPC-AG Acute Care Nurse Practitioner [place]

## 2022-09-22 NOTE — ED Notes (Signed)
Admitting at BS

## 2022-09-22 NOTE — ED Notes (Signed)
Wife at [facility]. Alert, NAD, calm. No changes. Sputum sent.

## 2022-09-23 ENCOUNTER — Inpatient Hospital Stay (HOSPITAL_COMMUNITY): Payer: Medicare Other

## 2022-09-23 DIAGNOSIS — J90 Pleural effusion, not elsewhere classified: Secondary | ICD-10-CM | POA: Diagnosis not present

## 2022-09-23 DIAGNOSIS — R1310 Dysphagia, unspecified: Secondary | ICD-10-CM

## 2022-09-23 DIAGNOSIS — L8990 Pressure ulcer of unspecified site, unspecified stage: Secondary | ICD-10-CM | POA: Insufficient documentation

## 2022-09-23 DIAGNOSIS — A419 Sepsis, unspecified organism: Secondary | ICD-10-CM | POA: Diagnosis not present

## 2022-09-23 DIAGNOSIS — R9431 Abnormal electrocardiogram [ECG] [EKG]: Secondary | ICD-10-CM

## 2022-09-23 DIAGNOSIS — J9601 Acute respiratory failure with hypoxia: Secondary | ICD-10-CM | POA: Diagnosis not present

## 2022-09-23 DIAGNOSIS — R6520 Severe sepsis without septic shock: Secondary | ICD-10-CM

## 2022-09-23 DIAGNOSIS — I4892 Unspecified atrial flutter: Secondary | ICD-10-CM

## 2022-09-23 LAB — ECHOCARDIOGRAM COMPLETE
AR max vel: 2.36 cm2
AV Area VTI: 2.51 cm2
AV Area mean vel: 2.26 cm2
AV Mean grad: 10 mmHg
AV Peak grad: 19.9 mmHg
Ao pk vel: 2.23 m/s
Area-P 1/2: 4.39 cm2
Height: 72 in
MV M vel: 2.75 m/s
MV Peak grad: 30.1 mmHg
S' Lateral: 1.8 cm
Weight: 2892.44 oz

## 2022-09-23 LAB — CBC WITH DIFFERENTIAL/PLATELET
Abs Immature Granulocytes: 0.61 10*3/uL — ABNORMAL HIGH (ref 0.00–0.07)
Basophils Absolute: 0 10*3/uL (ref 0.0–0.1)
Basophils Relative: 0 %
Eosinophils Absolute: 0 10*3/uL (ref 0.0–0.5)
Eosinophils Relative: 0 %
HCT: 35.6 % — ABNORMAL LOW (ref 39.0–52.0)
Hemoglobin: 10.8 g/dL — ABNORMAL LOW (ref 13.0–17.0)
Immature Granulocytes: 3 %
Lymphocytes Relative: 5 %
Lymphs Abs: 1 10*3/uL (ref 0.7–4.0)
MCH: 29.3 pg (ref 26.0–34.0)
MCHC: 30.3 g/dL (ref 30.0–36.0)
MCV: 96.7 fL (ref 80.0–100.0)
Monocytes Absolute: 1.3 10*3/uL — ABNORMAL HIGH (ref 0.1–1.0)
Monocytes Relative: 6 %
Neutro Abs: 17.8 10*3/uL — ABNORMAL HIGH (ref 1.7–7.7)
Neutrophils Relative %: 86 %
Platelets: 409 10*3/uL — ABNORMAL HIGH (ref 150–400)
RBC: 3.68 MIL/uL — ABNORMAL LOW (ref 4.22–5.81)
RDW: 13.4 % (ref 11.5–15.5)
WBC: 20.8 10*3/uL — ABNORMAL HIGH (ref 4.0–10.5)
nRBC: 0 % (ref 0.0–0.2)

## 2022-09-23 LAB — BASIC METABOLIC PANEL
Anion gap: 7 (ref 5–15)
BUN: 48 mg/dL — ABNORMAL HIGH (ref 8–23)
CO2: 21 mmol/L — ABNORMAL LOW (ref 22–32)
Calcium: 8.2 mg/dL — ABNORMAL LOW (ref 8.9–10.3)
Chloride: 109 mmol/L (ref 98–111)
Creatinine, Ser: 0.95 mg/dL (ref 0.61–1.24)
GFR, Estimated: >60 mL/min (ref >60)
Glucose, Bld: 121 mg/dL — ABNORMAL HIGH (ref 70–99)
Potassium: 4.6 mmol/L (ref 3.5–5.1)
Sodium: 137 mmol/L (ref 135–145)

## 2022-09-23 LAB — RESP PANEL BY RT-PCR (RSV, FLU A&B, COVID)  RVPGX2
Influenza A by PCR: NEGATIVE
Influenza B by PCR: NEGATIVE
Resp Syncytial Virus by PCR: NEGATIVE
SARS Coronavirus 2 by RT PCR: NEGATIVE

## 2022-09-23 LAB — TSH: TSH: 0.673 u[IU]/mL (ref 0.350–4.500)

## 2022-09-23 LAB — GLUCOSE, PLEURAL OR PERITONEAL FLUID: Glucose, Fluid: <20 mg/dL

## 2022-09-23 LAB — PROTEIN, PLEURAL OR PERITONEAL FLUID: Total protein, fluid: 3.5 g/dL

## 2022-09-23 LAB — LACTATE DEHYDROGENASE, PLEURAL OR PERITONEAL FLUID: LD, Fluid: >10000 U/L

## 2022-09-23 LAB — MAGNESIUM: Magnesium: 2.1 mg/dL (ref 1.7–2.4)

## 2022-09-23 LAB — PHOSPHORUS: Phosphorus: 3.8 mg/dL (ref 2.5–4.6)

## 2022-09-23 MED ORDER — METRONIDAZOLE 500 MG/100ML IV SOLN
500.0000 mg | Freq: Two times a day (BID) | INTRAVENOUS | Status: DC
  Administered 2022-09-23 – 2022-09-26 (×7): 500 mg via INTRAVENOUS
  Filled 2022-09-23 (×7): qty 100

## 2022-09-23 NOTE — Consult Note (Signed)
Cardiology Consultation   Patient ID: Ryan Reynolds MRN: [id number]; DOB: 02/19/1936  Admit date: 09/19/2022 Date of Consult: 09/23/2022  PCP:  Patient, No Pcp Per   Ryan Reynolds Providers Cardiologist:  New (Ryan Reynolds)   Patient Profile:   Ryan Reynolds is a 87 y.o. male with no known past medical history other than tobacco use who is being seen 09/23/2022 for the evaluation of atrial flutter at the request of Dr. Sloan Reynolds.  History of Present Illness:   Ryan Reynolds is a 87 year old male with no significant past medical history other than tobacco use. However, he does not regularly see a medical provider and he states he has not seen anyone in a couple of years. He presented to the [facility] ED on 09/15/2022 for further evaluation of weakness and fatigue. Upon arrival to the ED, he was hypotensive, tachycardic, and febrile with rectal temp of 100.9. WBC was elevated at 25,000 and chest x-ray showed complete opacification of the left hemithorax. Chest CT showed that this was a large loculated left pleural effusion suspicious for empyemas and there was a large amount of secretions noted within the trachea and left lung bronchi with postobstructive atelectasis/ pneumonia. He was admitted for sepsis secondary to a large loculated left pleural effusion/ emphysema with postobstructive atelectasis/pneumonia and was started on IV antibiotics, IV fluids, and Levophed. IR and PCCM were consulted and 3 chest tubes was placed on 1/8 and more than 1.5 L of purulent empyema was drained. Following this procedure, patient was noted to be tachycardic. EKG showed atrial flutter with rate of 100 bpm with variable AV conduction. Rates got as high as the 140s while in atrial flutter and he was started on IV Amiodarone. Cardiology consulted for further evaluation.  At the time of this evaluation, patient is resting comfortably in no acute distress. He thin and cachetic. He reports gradually  progressive weakness over the last 8-9 months. He also reports a cough and decreased appetite over this time. He estimates he has lost 40 lbs over the last year. He has been under a lot of stress taking care of his wife over the last 2 years who diagnosed with lung cancer and had a partial lobectomy in 2022 and then was found to have a brain tumor which was removed last year. He thought this stress was causing his decreased PO intake and weakness. His weakness significantly worsened just prior to admission and he states he was so weakness that he could not lift himself out of the chair which he is why he decided to come to the ED. He denies any fevers at home but does state that he is often cold. He denies any nasal congestion, nausea, vomiting, or diarrhea. He denies any cardiac symptoms - no chest pain, shortness of breath, orthopnea, PND, lower extremity edema, palpitations, lightheadedness, dizziness, or syncope. No abnormal bleeding in urine or stools.  He has a long smoking history. He smoked on and off since 1959 until he recently quit in November 2023 but admits that he was smoking for most of that time. No other alcohol or drug use.    History reviewed. No pertinent past medical history.  Past Surgical History:  Procedure Laterality Date   CATARACT EXTRACTION, BILATERAL Bilateral 2016   TONSILLECTOMY       Home Medications:  Prior to Admission medications   Medication Sig Start Date End Date Taking? Authorizing Provider  acetaminophen (TYLENOL) 325 MG tablet Take 650  mg by mouth every 6 (six) hours as needed for mild pain.   Yes [provider]  Multiple Vitamin (MULTIVITAMIN WITH MINERALS) TABS tablet Take 1 tablet by mouth daily.   Yes [provider]    Inpatient Medications: Scheduled Meds:  Chlorhexidine Gluconate Cloth  6 each Topical Daily   sodium chloride flush  10 mL Intrapleural Q8H   sodium chloride flush  3 mL Intravenous Q12H   Continuous  Infusions:  sodium chloride 125 mL/hr at 09/23/22 1200   sodium chloride Stopped (09/23/22 0942)   amiodarone 30 mg/hr (09/23/22 1200)   azithromycin Stopped (09/22/22 2116)   cefTRIAXone (ROCEPHIN)  IV Stopped (09/22/22 2243)   metronidazole Stopped (09/23/22 1044)   norepinephrine (LEVOPHED) Adult infusion Stopped (09/23/22 1046)   PRN Meds: acetaminophen **OR** acetaminophen, fentaNYL (SUBLIMAZE) injection, HYDROcodone-acetaminophen, ondansetron **OR** ondansetron (ZOFRAN) IV, mouth rinse, senna-docusate  Allergies:   No Known Allergies  Social History:   Social History   Socioeconomic History   Marital status: Married    Spouse name: Ryan Reynolds    Number of children: 1   Years of education: 2.5 year undergrad   Highest education level: Not on file  Occupational History   Occupation: Inventory control - [employer]    Comment: Works 2 days a week  Tobacco Use   Smoking status: Every Day    Packs/day: 1.00    Types: Cigarettes   Smokeless tobacco: Never  Vaping Use   Vaping Use: Never used  Substance and Sexual Activity   Alcohol use: No   Drug use: No   Sexual activity: Not on file  Other Topics Concern   Not on file  Social History Narrative   Lives at home with wife. No steps to front porch of 1 story home. 1 step to back porch.    Social Determinants of Health   Financial Resource Strain: Not on file  Food Insecurity: Not on file  Transportation Needs: Not on file  Physical Activity: Not on file  Stress: Not on file  Social Connections: Not on file  Intimate Partner Violence: Not on file    Family History:   Family History  Problem Relation Age of Onset   Stroke Mother    Stroke Father      ROS:  Please see the history of present illness.  Review of Systems  Constitutional:  Positive for malaise/fatigue and weight loss. Negative for fever.  HENT:  Negative for congestion.   Respiratory:  Positive for cough. Negative for shortness of breath.    Cardiovascular:  Negative for chest pain, palpitations, orthopnea, leg swelling and PND.  Gastrointestinal:  Negative for blood in stool, diarrhea, melena, nausea and vomiting.  Genitourinary:  Negative for hematuria.  Musculoskeletal:  Negative for myalgias.  Neurological:  Positive for weakness. Negative for dizziness and loss of consciousness.  Endo/Heme/Allergies:  Does not bruise/bleed easily.  Psychiatric/Behavioral:  Positive for substance abuse (long smoking history).    Physical Exam/Data:   Vitals:   09/23/22 1130 09/23/22 1145 09/23/22 1200 09/23/22 1215  BP: (!) 90/41  (!) 105/47   Pulse: 65 (!) 46 67 64  Resp: 19 19 (!) 27 (!) 28  Temp:   97.7 F (36.5 C)   TempSrc:   Axillary   SpO2: 95% 94% 98% 98%  Weight:      Height:        Intake/Output Summary (Last 24 hours) at 09/23/2022 1254 Last data filed at 09/23/2022 1200 Gross per 24 hour  Intake 5929.13 ml  Output 3700 ml  Net 2229.13 ml      09/29/2022    5:33 PM 01/08/2018    2:02 PM 07/10/2017    1:51 PM  Last 3 Weights  Weight (lbs) 180 lb 12.4 oz 181 lb 176 lb 9.6 oz  Weight (kg) 82 kg 82.101 kg 80.105 kg     Body mass index is 24.52 kg/m.  General: 87 y.o. thin cachetic Caucasian male resting comfortably in no acute distress. HEENT: Normocephalic and atraumatic. EOMs intact. Neck: Supple. No JVD. Heart: Borderline bradycardic with irregularly irregular rhythm.  No murmurs, gallops, or rubs.  Lungs: No significant increased work of breathing. Decreased breath sound on the left. No wheezes, rhonchi, or rales appreciated. Abdomen: Soft, non-distended, and non-tender to palpation.  Extremities: No lower extremity edema.    Skin: Warm and dry. Neuro: Alert and oriented x3. No focal deficits. Psych: Normal affect. Responds appropriately.  EKG:  The EKG was personally reviewed and demonstrates:  Atrial flutter, rate 100 bpm, with variable AV conduction and RBBB.  Telemetry:  Telemetry was personally reviewed  and demonstrates:  Atrial flutter with rates currently in the 50s to 60s but as high as the 140s on 09/22/2022  Relevant CV Studies:  Echo pending.  Laboratory Data:  High Sensitivity Troponin:   Recent Labs  Lab 10/02/2022 1752 10/13/2022 2000  TROPONINIHS 24* 20*     Chemistry Recent Labs  Lab 10/03/2022 1752 09/22/22 0430 09/22/22 2137 09/23/22 0258  NA 134* 136 140 137  K 5.1 4.9 5.0 4.6  CL 101 104 109 109  CO2 24 24 23  21*  GLUCOSE 188* 130* 104* 121*  BUN 50* 49* 47* 48*  CREATININE 1.38* 1.11 1.05 0.95  CALCIUM 9.6 8.9 8.5* 8.2*  MG 1.3* 1.7  --  2.1  GFRNONAA 50* >60 >60 >60  ANIONGAP 9 8 8 7     Recent Labs  Lab 10/07/2022 1752 09/22/22 0430 09/22/22 2137  PROT 6.1* 5.2* 4.9*  ALBUMIN 2.0* 1.8* 1.7*  AST 23 18 19   ALT 33 25 23  ALKPHOS 71 61 61  BILITOT 0.6 0.5 0.6   Lipids No results for input(s): "CHOL", "TRIG", "HDL", "LABVLDL", "LDLCALC", "CHOLHDL" in the last 168 hours.  Hematology Recent Labs  Lab 09/22/22 0430 09/22/22 2137 09/23/22 0258  WBC 26.7* 28.7* 20.8*  RBC 3.81* 3.68* 3.68*  HGB 10.9* 10.6* 10.8*  HCT 35.1* 36.7* 35.6*  MCV 92.1 99.7 96.7  MCH 28.6 28.8 29.3  MCHC 31.1 28.9* 30.3  RDW 13.4 13.5 13.4  PLT 412* 430* 409*   Thyroid  Recent Labs  Lab 09/23/22 0258  TSH 0.673    BNP Recent Labs  Lab 10/03/2022 1753  BNP 156.1*    DDimer No results for input(s): "DDIMER" in the last 168 hours.   Radiology/Studies:  DG CHEST PORT 1 VIEW  Result Date: 09/23/2022 CLINICAL DATA:  Pneumonia EXAM: PORTABLE CHEST 1 VIEW COMPARISON:  Radiograph 09/22/2022 FINDINGS: There are 2 left apical chest tubes and a left basilar chest tube unchanged in position. Unchanged residual pleural effusion and adjacent atelectasis. Unchanged small right pleural effusion. Unchanged small lucency in the left peripheral lung base consistent with pneumothorax, and with adjacent pleural thickening. Thoracic spondylosis. Bones are unchanged. IMPRESSION: Unchanged  left pleural effusion with small basilar gas component and multiple chest tubes in place. Unchanged adjacent atelectasis. Unchanged small right pleural effusion and atelectasis. Electronically Signed   By: Maurine Simmering M.D.   On: 09/23/2022 08:23  DG CHEST PORT 1 VIEW  Result Date: 09/22/2022 CLINICAL DATA:  Chest tube placement. EXAM: PORTABLE CHEST 1 VIEW COMPARISON:  10/12/2022. FINDINGS: Three pigtail chest tubes have been inserted on the left, 1 projecting over the upper medial aspect of the hemithorax, another over the mid to upper lateral hemithorax and the third over the inferior, medial left hemithorax. There has been significant improvement with a notable decrease in opacity in the left mid and upper lung consistent with evacuation of loculated pleural effusions. Opacity at the left base has improved. Small left lateral lung base pneumothorax, likely ex vacuole. No apical pneumothorax. There is opacity at the right lung base that has developed since the previous day's study. This is likely a combination of a small effusion and atelectasis. Remainder of the right lung is clear. IMPRESSION: 1. Significant interval improvement in left lung aeration after placement of 3 left hemithorax chest tubes. 2. Small left inferior pneumothorax. 3. New opacity at the right lung base suspected to be a small effusion with atelectasis. Electronically Signed   By: Lajean Manes M.D.   On: 09/22/2022 21:16   CT [facility] PLEURAL DRAIN W/INDWELL CATH W/IMG GUIDE  Result Date: 09/22/2022 INDICATION: 87 year old male with pneumonia and complex multiloculated left pleural effusion concerning for empyema. EXAM: CT-GUIDED CHEST TUBE PLACEMENT CT-GUIDED CHEST TUBE PLACEMENT CT-GUIDED CHEST TUBE PLACEMENT TECHNIQUE: Multidetector CT imaging of the CHEST was performed following the standard protocol WITHOUT IV contrast. RADIATION DOSE REDUCTION: This exam was performed according to the departmental dose-optimization program which  includes automated exposure control, adjustment of the mA and/or kV according to patient size and/or use of iterative reconstruction technique. MEDICATIONS: The patient is currently admitted to the hospital and receiving intravenous antibiotics. The antibiotics were administered within an appropriate time frame prior to the initiation of the procedure. ANESTHESIA/SEDATION: Moderate (conscious) sedation was employed during this procedure. A total of Versed 1 mg and Fentanyl 50 mcg was administered intravenously by the radiology nurse. Total intra-service moderate Sedation Time: 58 minutes. The patient's level of consciousness and vital signs were monitored continuously by radiology nursing throughout the procedure under my direct supervision. COMPLICATIONS: None immediate. PROCEDURE: Informed written consent was obtained from the patient after a thorough discussion of the procedural risks, benefits and alternatives. All questions were addressed. Maximal Sterile Barrier Technique was utilized including caps, mask, sterile gowns, sterile gloves, sterile drape, hand hygiene and skin antiseptic. A timeout was performed prior to the initiation of the procedure. CHEST TUBE #1 A planning axial CT scan was performed. The largest fluid collection in the more inferior aspect of the lung was targeted. A suitable skin entry site was selected and marked. The skin was sterilely prepped and draped in the standard fashion using chlorhexidine skin prep. Local anesthesia was attained by infiltration with 1% lidocaine. A small dermatotomy was made. A 22 gauge spinal needle was advanced and imaging obtained to confirm an appropriate trajectory between the ribs and into the fluid collection. Once this was confirmed, a 60 [place] all-purpose drainage catheter modified with additional sideholes was advanced over the rib and into the pleural space using trocar technique. The catheter was connected to low wall suction via a Sahara device.  Aspiration yields at least 800 mL of opaque foul-smelling purulent fluid. A sample was sent aside for Gram stain and culture. The catheter was secured to the skin with 0 silk suture and sterile bandages were applied. Repeat CT imaging demonstrates no decrease in the other 2 loculated fluid collections consistent  with the suspicion the day are indeed completely separate. Therefore, plans were made to proceed with additional chest tube placement. CHEST TUBE #2 The posterior apical fluid collection was targeted next. A suitable skin entry site was selected and marked. The skin was sterilely prepped and draped in the standard fashion using chlorhexidine skin prep. Local anesthesia was attained by infiltration with 1% lidocaine. A small dermatotomy was made. A 22 gauge spinal needle was advanced and imaging obtained to confirm an appropriate trajectory between the ribs and into the fluid collection. Once this was confirmed, a 10 [place] all-purpose drainage catheter advanced over the rib and into the pleural space using trocar technique. The catheter was connected to low wall suction via a Sahara device. Aspiration yields 80 mL of opaque foul-smelling purulent bloody fluid. The catheter was secured to the skin with 0 silk suture and sterile bandages were applied. CHEST TUBE #3 The posterior apical fluid collection was targeted next. A suitable skin entry site was selected and marked. The skin was sterilely prepped and draped in the standard fashion using chlorhexidine skin prep. Local anesthesia was attained by infiltration with 1% lidocaine. A small dermatotomy was made. A 22 gauge spinal needle was advanced and imaging obtained to confirm an appropriate trajectory between the ribs and into the fluid collection. Once this was confirmed, a 10 [place] all-purpose drainage catheter advanced over the rib and into the pleural space using trocar technique. Approximately 800 mL of foul-smelling purulent fluid was then aspirated  using a 60 cc syringe and an abscess evacuation bag. The drainage catheter was then connected to waterseal. The catheter was secured to the skin with 0 silk suture and sterile bandages were applied. IMPRESSION: 1. Placement of a total of 3 percutaneous pigtail chest tubes into the left pleural space (Tube #1, a 12 [place] via the left lower lateral chest into the most inferior and largest collection; tube #2, 10 [place] in the posterior apex, Tube #3, 12F in the left upper anterior collection). 2. Overall, at least 1,800 mL of foul-smelling purulent fluid was aspirated between the 3 drainage catheters. A sample was sent for Gram stain and culture. Electronically Signed   By: Jacqulynn Cadet M.D.   On: 09/22/2022 17:59   CT [facility] PLEURAL DRAIN W/INDWELL CATH W/IMG GUIDE  Result Date: 09/22/2022 INDICATION: 87 year old male with pneumonia and complex multiloculated left pleural effusion concerning for empyema. EXAM: CT-GUIDED CHEST TUBE PLACEMENT CT-GUIDED CHEST TUBE PLACEMENT CT-GUIDED CHEST TUBE PLACEMENT TECHNIQUE: Multidetector CT imaging of the CHEST was performed following the standard protocol WITHOUT IV contrast. RADIATION DOSE REDUCTION: This exam was performed according to the departmental dose-optimization program which includes automated exposure control, adjustment of the mA and/or kV according to patient size and/or use of iterative reconstruction technique. MEDICATIONS: The patient is currently admitted to the hospital and receiving intravenous antibiotics. The antibiotics were administered within an appropriate time frame prior to the initiation of the procedure. ANESTHESIA/SEDATION: Moderate (conscious) sedation was employed during this procedure. A total of Versed 1 mg and Fentanyl 50 mcg was administered intravenously by the radiology nurse. Total intra-service moderate Sedation Time: 58 minutes. The patient's level of consciousness and vital signs were monitored continuously by radiology nursing  throughout the procedure under my direct supervision. COMPLICATIONS: None immediate. PROCEDURE: Informed written consent was obtained from the patient after a thorough discussion of the procedural risks, benefits and alternatives. All questions were addressed. Maximal Sterile Barrier Technique was utilized including caps, mask, sterile gowns, sterile gloves, sterile drape,  hand hygiene and skin antiseptic. A timeout was performed prior to the initiation of the procedure. CHEST TUBE #1 A planning axial CT scan was performed. The largest fluid collection in the more inferior aspect of the lung was targeted. A suitable skin entry site was selected and marked. The skin was sterilely prepped and draped in the standard fashion using chlorhexidine skin prep. Local anesthesia was attained by infiltration with 1% lidocaine. A small dermatotomy was made. A 22 gauge spinal needle was advanced and imaging obtained to confirm an appropriate trajectory between the ribs and into the fluid collection. Once this was confirmed, a 66 [place] all-purpose drainage catheter modified with additional sideholes was advanced over the rib and into the pleural space using trocar technique. The catheter was connected to low wall suction via a Sahara device. Aspiration yields at least 800 mL of opaque foul-smelling purulent fluid. A sample was sent aside for Gram stain and culture. The catheter was secured to the skin with 0 silk suture and sterile bandages were applied. Repeat CT imaging demonstrates no decrease in the other 2 loculated fluid collections consistent with the suspicion the day are indeed completely separate. Therefore, plans were made to proceed with additional chest tube placement. CHEST TUBE #2 The posterior apical fluid collection was targeted next. A suitable skin entry site was selected and marked. The skin was sterilely prepped and draped in the standard fashion using chlorhexidine skin prep. Local anesthesia was attained by  infiltration with 1% lidocaine. A small dermatotomy was made. A 22 gauge spinal needle was advanced and imaging obtained to confirm an appropriate trajectory between the ribs and into the fluid collection. Once this was confirmed, a 10 [place] all-purpose drainage catheter advanced over the rib and into the pleural space using trocar technique. The catheter was connected to low wall suction via a Sahara device. Aspiration yields 80 mL of opaque foul-smelling purulent bloody fluid. The catheter was secured to the skin with 0 silk suture and sterile bandages were applied. CHEST TUBE #3 The posterior apical fluid collection was targeted next. A suitable skin entry site was selected and marked. The skin was sterilely prepped and draped in the standard fashion using chlorhexidine skin prep. Local anesthesia was attained by infiltration with 1% lidocaine. A small dermatotomy was made. A 22 gauge spinal needle was advanced and imaging obtained to confirm an appropriate trajectory between the ribs and into the fluid collection. Once this was confirmed, a 10 [place] all-purpose drainage catheter advanced over the rib and into the pleural space using trocar technique. Approximately 800 mL of foul-smelling purulent fluid was then aspirated using a 60 cc syringe and an abscess evacuation bag. The drainage catheter was then connected to waterseal. The catheter was secured to the skin with 0 silk suture and sterile bandages were applied. IMPRESSION: 1. Placement of a total of 3 percutaneous pigtail chest tubes into the left pleural space (Tube #1, a 12 [place] via the left lower lateral chest into the most inferior and largest collection; tube #2, 10 [place] in the posterior apex, Tube #3, 40F in the left upper anterior collection). 2. Overall, at least 1,800 mL of foul-smelling purulent fluid was aspirated between the 3 drainage catheters. A sample was sent for Gram stain and culture. Electronically Signed   By: Jacqulynn Cadet  M.D.   On: 09/22/2022 17:59   CT [facility] PLEURAL DRAIN W/INDWELL CATH W/IMG GUIDE  Result Date: 09/22/2022 INDICATION: 87 year old male with pneumonia and complex multiloculated left pleural effusion  concerning for empyema. EXAM: CT-GUIDED CHEST TUBE PLACEMENT CT-GUIDED CHEST TUBE PLACEMENT CT-GUIDED CHEST TUBE PLACEMENT TECHNIQUE: Multidetector CT imaging of the CHEST was performed following the standard protocol WITHOUT IV contrast. RADIATION DOSE REDUCTION: This exam was performed according to the departmental dose-optimization program which includes automated exposure control, adjustment of the mA and/or kV according to patient size and/or use of iterative reconstruction technique. MEDICATIONS: The patient is currently admitted to the hospital and receiving intravenous antibiotics. The antibiotics were administered within an appropriate time frame prior to the initiation of the procedure. ANESTHESIA/SEDATION: Moderate (conscious) sedation was employed during this procedure. A total of Versed 1 mg and Fentanyl 50 mcg was administered intravenously by the radiology nurse. Total intra-service moderate Sedation Time: 58 minutes. The patient's level of consciousness and vital signs were monitored continuously by radiology nursing throughout the procedure under my direct supervision. COMPLICATIONS: None immediate. PROCEDURE: Informed written consent was obtained from the patient after a thorough discussion of the procedural risks, benefits and alternatives. All questions were addressed. Maximal Sterile Barrier Technique was utilized including caps, mask, sterile gowns, sterile gloves, sterile drape, hand hygiene and skin antiseptic. A timeout was performed prior to the initiation of the procedure. CHEST TUBE #1 A planning axial CT scan was performed. The largest fluid collection in the more inferior aspect of the lung was targeted. A suitable skin entry site was selected and marked. The skin was sterilely prepped and  draped in the standard fashion using chlorhexidine skin prep. Local anesthesia was attained by infiltration with 1% lidocaine. A small dermatotomy was made. A 22 gauge spinal needle was advanced and imaging obtained to confirm an appropriate trajectory between the ribs and into the fluid collection. Once this was confirmed, a 78 [place] all-purpose drainage catheter modified with additional sideholes was advanced over the rib and into the pleural space using trocar technique. The catheter was connected to low wall suction via a Sahara device. Aspiration yields at least 800 mL of opaque foul-smelling purulent fluid. A sample was sent aside for Gram stain and culture. The catheter was secured to the skin with 0 silk suture and sterile bandages were applied. Repeat CT imaging demonstrates no decrease in the other 2 loculated fluid collections consistent with the suspicion the day are indeed completely separate. Therefore, plans were made to proceed with additional chest tube placement. CHEST TUBE #2 The posterior apical fluid collection was targeted next. A suitable skin entry site was selected and marked. The skin was sterilely prepped and draped in the standard fashion using chlorhexidine skin prep. Local anesthesia was attained by infiltration with 1% lidocaine. A small dermatotomy was made. A 22 gauge spinal needle was advanced and imaging obtained to confirm an appropriate trajectory between the ribs and into the fluid collection. Once this was confirmed, a 10 [place] all-purpose drainage catheter advanced over the rib and into the pleural space using trocar technique. The catheter was connected to low wall suction via a Sahara device. Aspiration yields 80 mL of opaque foul-smelling purulent bloody fluid. The catheter was secured to the skin with 0 silk suture and sterile bandages were applied. CHEST TUBE #3 The posterior apical fluid collection was targeted next. A suitable skin entry site was selected and marked.  The skin was sterilely prepped and draped in the standard fashion using chlorhexidine skin prep. Local anesthesia was attained by infiltration with 1% lidocaine. A small dermatotomy was made. A 22 gauge spinal needle was advanced and imaging obtained to confirm an appropriate trajectory  between the ribs and into the fluid collection. Once this was confirmed, a 10 [place] all-purpose drainage catheter advanced over the rib and into the pleural space using trocar technique. Approximately 800 mL of foul-smelling purulent fluid was then aspirated using a 60 cc syringe and an abscess evacuation bag. The drainage catheter was then connected to waterseal. The catheter was secured to the skin with 0 silk suture and sterile bandages were applied. IMPRESSION: 1. Placement of a total of 3 percutaneous pigtail chest tubes into the left pleural space (Tube #1, a 12 [place] via the left lower lateral chest into the most inferior and largest collection; tube #2, 10 [place] in the posterior apex, Tube #3, 79F in the left upper anterior collection). 2. Overall, at least 1,800 mL of foul-smelling purulent fluid was aspirated between the 3 drainage catheters. A sample was sent for Gram stain and culture. Electronically Signed   By: Jacqulynn Cadet M.D.   On: 09/22/2022 17:59   CT CHEST WO CONTRAST  Result Date: 09/29/2022 CLINICAL DATA:  Pneumonia complication suspected. X-ray done showing pleural effusion. Malignancy suspected. Congestion and cough EXAM: CT CHEST WITHOUT CONTRAST TECHNIQUE: Multidetector CT imaging of the chest was performed following the standard protocol without IV contrast. RADIATION DOSE REDUCTION: This exam was performed according to the departmental dose-optimization program which includes automated exposure control, adjustment of the mA and/or kV according to patient size and/or use of iterative reconstruction technique. COMPARISON:  Radiographs earlier today FINDINGS: Cardiovascular: Normal heart size.  Small pericardial effusion. Aortic valve and mitral annular calcification. Coronary artery and aortic atherosclerotic calcification. Mediastinum/Nodes: 1.9 cm left thyroid nodule. Unremarkable esophagus. No definite thoracic adenopathy on noncontrast exam. Lungs/Pleura: Layering secretions in the trachea extending into the left mainstem bronchus where there is near occlusion. Mucous plugging throughout the left lingular and lower lobe bronchi. Mild diffuse bronchial wall thickening. Large loculated pleural effusion on the left. There is suggestion of wall thickening about the loculated effusions compatible with empyemas though this is incompletely evaluated without IV contrast. Near-complete collapse of both the left upper and lower lobes. Right posterior lower atelectasis/scarring and pleural thickening. Emphysema. Upper Abdomen: Poorly evaluated.  No definite acute abnormality. Musculoskeletal: No acute osseous abnormality. IMPRESSION: Large loculated left pleural effusion. This is suspicious for empyemas though is incompletely evaluated without IV contrast. Malignant effusion is not excluded. Large amount of secretions within the trachea and left lung bronchi with postobstructive atelectasis/pneumonia. Coronary artery atherosclerosis. Aortic Atherosclerosis (ICD10-I70.0) and Emphysema (ICD10-J43.9). 1.9 cm left thyroid nodule. Nonemergent ultrasound could be performed if clinically indicated given comorbidities and life expectancy. Electronically Signed   By: Placido Sou M.D.   On: 10/15/2022 20:36   DG Chest Port 1 View  Result Date: 09/20/2022 CLINICAL DATA:  New onset AFib EXAM: PORTABLE CHEST 1 VIEW COMPARISON:  None. FINDINGS: Cardiac silhouette appears enlarged. There is complete opacification of the left hemithorax. Right lung is clear. No pneumothorax. No acute fractures. IMPRESSION: Complete opacification of the left hemithorax, likely combination of pleural effusion and atelectasis/consolidation.  Electronically Signed   By: Ronney Asters M.D.   On: 09/22/2022 18:36     Assessment and Plan:   New Onset Atrial Flutter Patient was admitted with sepsis secondary to pneumonia and large left empyema. Developed new onset atrial flutter on 1/8 with rates as fast as the 140s. He was started on IV Amiodarone with improvement in rate control. - Still in atrial flutter but rates controlled. Rates currently in the 50s to 35s. -  Echo pending. - Magnesium was initially low but this was repleted and improved to 2.1 today. Potassium and TSH okay. - Continue IV Amiodarone. - CHA2DS-VASc = 3 (coronary calcifications noted on CT and age x2).  Will hold off on anticoagulation at this time as patient as 3 chest tubes in place. Can consider Eliquis prior to discharge. - Suspect atrial flutter is secondary to underlying illness and will hopefully improve as this is treated.Marland Kitchen He is very frail and cachetic and reports a 40 lb weight loss over the last year. Will defer further evaluation of this to primary team.   Dysphagia Patient choked on some water when I was in the room and did admit to some trouble swallowing at home prior to admission.  - Will make NPO and consult speech therapy for potential swallow study.  Otherwise, per primary team: - Sepsis and acute hypoxic respiratory failure secondary to pneumonia and large left empyema - Large loculated pleural effusion/ empyema s/p placement of 3 chest tubes on 1/8 - Hypotension secondary to sepsis: now off Levophed - Thyroid nodule   Risk Assessment/Risk Scores:    CHA2DS2-VASc Score = 3  This indicates a 3.2% annual risk of stroke. The patient's score is based upon: CHF History: 0 HTN History: 0 Diabetes History: 0 Stroke History: 0 Vascular Disease History: 1 Age Score: 2 Gender Score: 0     For questions or updates, please contact [facility] Please consult [email or web address] for contact info under    Signed, Corrin Parker,  PA-C  09/23/2022 12:54 PM

## 2022-09-23 NOTE — Consult Note (Signed)
NAME:  Ryan Reynolds, MRN:  [id number], DOB:  1936-03-18, LOS: 2 ADMISSION DATE:  2022/09/27, CONSULTATION DATE:  09/23/22 REFERRING MD:  TRH, CHIEF COMPLAINT:  hypotension   History of Present Illness:  87 yo with no known pmh with exception of tobacoo use per chart review presents with cough and fatigue found to have suspected empyema, pneumonia and ? Of underlying malignancy. Pt was started on empiric abx, IR consulted and pt had 3 chest tubes placed with large volume output over past 24 hours. He was taken to [facility] for close monitoring when he began hypotensive for which CCM was consulted today.   Upon evaluation pt was on 61mcg of levo (previously 9) and alert oriented. He declined CVC placement stating that he feels we are on the "upswing" and reiterating his "DNR" status.  Cx and cytology pending for pleural fluid. Cont volume resuscitation as well as abx therapy.   He denies complaints stating that his chest pain actually resolved with chest tube placement, no fever chills, slight cough but endorses it is improved from prior to presentation as well.   Pertinent  Medical History  Tobacco use (ceassation 11/23)  Significant Hospital Events: Including procedures, antibiotic start and stop dates in addition to other pertinent events   Admitted 09-27-22 Chest tube placed 01/08 Ccm consulted 01/09  Interim History / Subjective:  As per HPI  Objective   Blood pressure 112/66, pulse 74, temperature 97.6 F (36.4 C), temperature source Axillary, resp. rate 18, height 6' (1.829 m), weight 82 kg, SpO2 95 %.    FiO2 (%):  [100 %] 100 %   Intake/Output Summary (Last 24 hours) at 09/23/2022 0332 Last data filed at 09/23/2022 0200 Gross per 24 hour  Intake 4807.97 ml  Output 3545 ml  Net 1262.97 ml   Filed Weights   09-27-2022 1733  Weight: 82 kg    Examination: General: alert oriented laying on right side comfortably without resp discomfort HENT: ncat, eomi, perrla mm dry but pink Lungs:  diminished on L diffusely and R base otherwise R clear, 3 chest tubes on L  Cardiovascular: rrr no m/g/r Abdomen: soft, non tender/non distended Extremities: no c/c/e Neuro: no focal deficits GU: deferred  Resolved Hospital Problem list     Assessment & Plan:  L pleural effusion:  -suspected empyema -cx and cytology pending -follow and de-escalate as able.  -may warrant bronchoscopic evaluation in light of concern for nodules and post obstructive pna  Sepsis 2/2 pneumonia/empyema:  -as above  Hypotension:  -2/2 above -titrate vasopressors -in light of pt's request and additionally his DNR status would increase allowed piv rate to at least 15-18mcg if necessary without addition of cvc.  -suspect that BP will improve with continued drainage and clearance of infection as well as abx.    Best Practice (right click and "Reselect all SmartList Selections" daily)   Per primary  Labs   CBC: Recent Labs  Lab Sep 27, 2022 1752 09/22/22 0430 09/22/22 2137  WBC 25.5* 26.7* 28.7*  HGB 11.9* 10.9* 10.6*  HCT 37.5* 35.1* 36.7*  MCV 90.8 92.1 99.7  PLT 510* 412* 430*    Basic Metabolic Panel: Recent Labs  Lab September 27, 2022 1752 09/22/22 0430 09/22/22 2137  NA 134* 136 140  K 5.1 4.9 5.0  CL 101 104 109  CO2 24 24 23   GLUCOSE 188* 130* 104*  BUN 50* 49* 47*  CREATININE 1.38* 1.11 1.05  CALCIUM 9.6 8.9 8.5*  MG 1.3* 1.7  --  GFR: Estimated Creatinine Clearance: 57.5 mL/min (by C-G formula based on SCr of 1.05 mg/dL). Recent Labs  Lab 10/13/2022 1752 09/30/2022 2000 09/22/22 0430 09/22/22 2137  PROCALCITON  --   --  0.61  --   WBC 25.5*  --  26.7* 28.7*  LATICACIDVEN  --  1.9  --   --     Liver Function Tests: Recent Labs  Lab 09/18/2022 1752 09/22/22 0430 09/22/22 2137  AST 23 18 19   ALT 33 25 23  ALKPHOS 71 61 61  BILITOT 0.6 0.5 0.6  PROT 6.1* 5.2* 4.9*  ALBUMIN 2.0* 1.8* 1.7*   No results for input(s): "LIPASE", "AMYLASE" in the last 168 hours. No results  for input(s): "AMMONIA" in the last 168 hours.  ABG No results found for: "PHART", "PCO2ART", "PO2ART", "HCO3", "TCO2", "ACIDBASEDEF", "O2SAT"   Coagulation Profile: Recent Labs  Lab 09/22/22 0900  INR 1.4*    Cardiac Enzymes: No results for input(s): "CKTOTAL", "CKMB", "CKMBINDEX", "TROPONINI" in the last 168 hours.  HbA1C: No results found for: "HGBA1C"  CBG: No results for input(s): "GLUCAP" in the last 168 hours.  Review of Systems:   As per HPI  Past Medical History:  He,  has no past medical history on file.   Surgical History:   Past Surgical History:  Procedure Laterality Date   CATARACT EXTRACTION, BILATERAL Bilateral 2016   TONSILLECTOMY       Social History:   reports that he has been smoking cigarettes. He has been smoking an average of 1 pack per day. He has never used smokeless tobacco. He reports that he does not drink alcohol and does not use drugs.   Family History:  His family history includes Stroke in his father and mother.   Allergies No Known Allergies   Home Medications  Prior to Admission medications   Medication Sig Start Date End Date Taking? Authorizing Provider  amoxicillin-clavulanate (AUGMENTIN) 875-125 MG tablet Take 1 tablet by mouth every 12 (twelve) hours. 06/03/19   06/05/19, PA-C     Critical care time: Belinda Fisher

## 2022-09-23 NOTE — Progress Notes (Signed)
Chief Complaint: Patient was seen today for left chest tubes   Supervising Physician: Gilmer Mor  Patient Status: [facility] - In-pt  Subjective: S/p placement of 3 left sided chest tubes yesterday for empyema. Pt resting in bed. Denies much discomfort. Per RN, additional fluid sent for cytology.  Objective: Physical Exam: BP (!) 91/45   Pulse (!) 46   Temp (!) 97.4 F (36.3 C) (Axillary)   Resp 20   Ht 6' (1.829 m)   Wt 180 lb 12.4 oz (82 kg)   SpO2 98%   BMI 24.52 kg/m  All 3 drains intact, sites clean, NT Purulent output from all 3.   Current Facility-Administered Medications:    0.9 %  sodium chloride infusion, , Intravenous, Continuous, Dorcas Carrow, MD, Last Rate: 125 mL/hr at 09/23/22 1119, New Bag at 09/23/22 1119   0.9 %  sodium chloride infusion, 250 mL, Intravenous, Continuous, Luiz Iron, NP, Stopped at 09/23/22 [id number]   acetaminophen (TYLENOL) tablet 650 mg, 650 mg, Oral, Q6H PRN **OR** acetaminophen (TYLENOL) suppository 650 mg, 650 mg, Rectal, Q6H PRN, Charlsie Quest, MD   [COMPLETED] amiodarone (NEXTERONE) 1.8 mg/mL load via infusion 150 mg, 150 mg, Intravenous, Once, 150 mg at 09/22/22 2224 **FOLLOWED BY** [EXPIRED] amiodarone (NEXTERONE PREMIX) 360-4.14 MG/200ML-% (1.8 mg/mL) IV infusion, 60 mg/hr, Intravenous, Continuous, Stopped at 09/23/22 0439 **FOLLOWED BY** amiodarone (NEXTERONE PREMIX) 360-4.14 MG/200ML-% (1.8 mg/mL) IV infusion, 30 mg/hr, Intravenous, Continuous, Ghimire, Lyndel Safe, MD, Last Rate: 16.67 mL/hr at 09/23/22 1111, 30 mg/hr at 09/23/22 1111   azithromycin (ZITHROMAX) 500 mg in sodium chloride 0.9 % 250 mL IVPB, 500 mg, Intravenous, Q24H, Gerhard Munch, MD, Stopped at 09/22/22 2116   cefTRIAXone (ROCEPHIN) 2 g in sodium chloride 0.9 % 100 mL IVPB, 2 g, Intravenous, Q24H, Gerhard Munch, MD, Stopped at 09/22/22 2243   Chlorhexidine Gluconate Cloth 2 % PADS 6 each, 6 each, Topical, Daily, Dorcas Carrow, MD, 6 each at 09/23/22 0950    fentaNYL (SUBLIMAZE) injection 12.5 mcg, 12.5 mcg, Intravenous, Q2H PRN, Dorcas Carrow, MD, 12.5 mcg at 09/22/22 2002   HYDROcodone-acetaminophen (NORCO/VICODIN) 5-325 MG per tablet 1 tablet, 1 tablet, Oral, Q6H PRN, Dorcas Carrow, MD   metroNIDAZOLE (FLAGYL) IVPB 500 mg, 500 mg, Intravenous, Q12H, Dorcas Carrow, MD, Stopped at 09/23/22 1044   norepinephrine (LEVOPHED) 4mg  in (0.016 mg/mL) premix infusion, 2-10 mcg/min, Intravenous, Titrated, , NP, Stopped at 09/23/22 1046   ondansetron (ZOFRAN) tablet 4 mg, 4 mg, Oral, Q6H PRN **OR** ondansetron (ZOFRAN) injection 4 mg, 4 mg, Intravenous, Q6H PRN, 11/22/22, MD   Oral care mouth rinse, 15 mL, Mouth Rinse, PRN, Charlsie Quest, MD   senna-docusate (Senokot-S) tablet 1 tablet, 1 tablet, Oral, QHS PRN, Dorcas Carrow, Vishal R, MD   sodium chloride flush (NS) 0.9 % injection 10 mL, 10 mL, Intrapleural, Q8H, Allena Katz, Heath K, MD, 10 mL at 09/23/22 0110   sodium chloride flush (NS) 0.9 % injection 3 mL, 3 mL, Intravenous, Q12H, 11/22/22 R, MD, 3 mL at 09/22/22 2119  Labs: CBC Recent Labs    09/22/22 2137 09/23/22 0258  WBC 28.7* 20.8*  HGB 10.6* 10.8*  HCT 36.7* 35.6*  PLT 430* 409*   BMET Recent Labs    09/22/22 2137 09/23/22 0258  NA 140 137  K 5.0 4.6  CL 109 109  CO2 23 21*  GLUCOSE 104* 121*  BUN 47* 48*  CREATININE 1.05 0.95  CALCIUM 8.5* 8.2*   LFT Recent Labs  09/22/22 2137  PROT 4.9*  ALBUMIN 1.7*  AST 19  ALT 23  ALKPHOS 61  BILITOT 0.6   PT/INR Recent Labs    09/22/22 0900  LABPROT 17.3*  INR 1.4*     Studies/Results:  CT PERC PLEURAL DRAIN W/INDWELL CATH W/IMG GUIDE  Result Date: 09/22/2022 INDICATION: 87 year old male with pneumonia and complex multiloculated left pleural effusion concerning for empyema. EXAM: CT-GUIDED CHEST TUBE PLACEMENT CT-GUIDED CHEST TUBE PLACEMENT CT-GUIDED CHEST TUBE PLACEMENT TECHNIQUE: Multidetector CT imaging of the CHEST was performed  following the standard protocol WITHOUT IV contrast. RADIATION DOSE REDUCTION: This exam was performed according to the departmental dose-optimization program which includes automated exposure control, adjustment of the mA and/or kV according to patient size and/or use of iterative reconstruction technique. MEDICATIONS: The patient is currently admitted to the hospital and receiving intravenous antibiotics. The antibiotics were administered within an appropriate time frame prior to the initiation of the procedure. ANESTHESIA/SEDATION: Moderate (conscious) sedation was employed during this procedure. A total of Versed 1 mg and Fentanyl 50 mcg was administered intravenously by the radiology nurse. Total intra-service moderate Sedation Time: 58 minutes. The patient's level of consciousness and vital signs were monitored continuously by radiology nursing throughout the procedure under my direct supervision. COMPLICATIONS: None immediate. PROCEDURE: Informed written consent was obtained from the patient after a thorough discussion of the procedural risks, benefits and alternatives. All questions were addressed. Maximal Sterile Barrier Technique was utilized including caps, mask, sterile gowns, sterile gloves, sterile drape, hand hygiene and skin antiseptic. A timeout was performed prior to the initiation of the procedure. CHEST TUBE #1 A planning axial CT scan was performed. The largest fluid collection in the more inferior aspect of the lung was targeted. A suitable skin entry site was selected and marked. The skin was sterilely prepped and draped in the standard fashion using chlorhexidine skin prep. Local anesthesia was attained by infiltration with 1% lidocaine. A small dermatotomy was made. A 22 gauge spinal needle was advanced and imaging obtained to confirm an appropriate trajectory between the ribs and into the fluid collection. Once this was confirmed, a 49 [place] all-purpose drainage catheter modified with  additional sideholes was advanced over the rib and into the pleural space using trocar technique. The catheter was connected to low wall suction via a Sahara device. Aspiration yields at least 800 mL of opaque foul-smelling purulent fluid. A sample was sent aside for Gram stain and culture. The catheter was secured to the skin with 0 silk suture and sterile bandages were applied. Repeat CT imaging demonstrates no decrease in the other 2 loculated fluid collections consistent with the suspicion the day are indeed completely separate. Therefore, plans were made to proceed with additional chest tube placement. CHEST TUBE #2 The posterior apical fluid collection was targeted next. A suitable skin entry site was selected and marked. The skin was sterilely prepped and draped in the standard fashion using chlorhexidine skin prep. Local anesthesia was attained by infiltration with 1% lidocaine. A small dermatotomy was made. A 22 gauge spinal needle was advanced and imaging obtained to confirm an appropriate trajectory between the ribs and into the fluid collection. Once this was confirmed, a 10 [place] all-purpose drainage catheter advanced over the rib and into the pleural space using trocar technique. The catheter was connected to low wall suction via a Sahara device. Aspiration yields 80 mL of opaque foul-smelling purulent bloody fluid. The catheter was secured to the skin with 0 silk suture and sterile bandages were  applied. CHEST TUBE #3 The posterior apical fluid collection was targeted next. A suitable skin entry site was selected and marked. The skin was sterilely prepped and draped in the standard fashion using chlorhexidine skin prep. Local anesthesia was attained by infiltration with 1% lidocaine. A small dermatotomy was made. A 22 gauge spinal needle was advanced and imaging obtained to confirm an appropriate trajectory between the ribs and into the fluid collection. Once this was confirmed, a 10 [place]  all-purpose drainage catheter advanced over the rib and into the pleural space using trocar technique. Approximately 800 mL of foul-smelling purulent fluid was then aspirated using a 60 cc syringe and an abscess evacuation bag. The drainage catheter was then connected to waterseal. The catheter was secured to the skin with 0 silk suture and sterile bandages were applied. IMPRESSION: 1. Placement of a total of 3 percutaneous pigtail chest tubes into the left pleural space (Tube #1, a 12 [place] via the left lower lateral chest into the most inferior and largest collection; tube #2, 10 [place] in the posterior apex, Tube #3, 80F in the left upper anterior collection). 2. Overall, at least 1,800 mL of foul-smelling purulent fluid was aspirated between the 3 drainage catheters. A sample was sent for Gram stain and culture. Electronically Signed   By: Jacqulynn Cadet M.D.   On: 09/22/2022 17:59     Assessment/Plan: Left Empyema S/p left chest drains x 3 yesterday. Purulent output. Cx and cyto pending WBC down to 20k IR following    LOS: 2 days   I spent a total of 15 minutes in face to face in clinical consultation, greater than 50% of which was counseling/coordinating care for Left chest drains  Ascencion Dike PA-C 09/23/2022 11:19 AM

## 2022-09-23 NOTE — Evaluation (Signed)
Clinical/Bedside Swallow Evaluation Patient Details  Name: Ryan Reynolds MRN: [id number] Date of Birth: 1935-10-03  Today's Date: 09/23/2022 Time: SLP Start Time (ACUTE ONLY): 1415 SLP Stop Time (ACUTE ONLY): 1430 SLP Time Calculation (min) (ACUTE ONLY): 15 min  Past Medical History: History reviewed. No pertinent past medical history. Past Surgical History:  Past Surgical History:  Procedure Laterality Date   CATARACT EXTRACTION, BILATERAL Bilateral 2016   TONSILLECTOMY     HPI:  87 year old gentleman with history of previous smoking presented with more than 2 weeks of cough and fatigue, 50 pound weight loss for last 1 year, progressive weakness.  Chest x-ray showed complete opacification of the left hemithorax, CT scan of the chest showed large loculated left pleural effusion. Found to have severe sepsis with loculated empyema; chest tube placements. MD made pt NPO and ordered ST. Pt choked on water and admitted to swallow difficulty when cardiology PA in the room.    Assessment / Plan / Recommendation  Clinical Impression  BSE completed and suspect he may have a primary esophageal based dysphagia. He affirms intermittent globus sensation and points to his lower pharynx and states "if feels tight". He did have several delayed throat clears but no instances of coughing. Swallows appeared a little efforful and he admits to a "tight feeling". Therapist encouraged hard/effortful swallows and sips water. Pt stated he would prefer to have chopped meats and Dys 3/thin liquids ordered. Strategies were discussed with pt to reduce symptoms including: proper positioning, effortful swallows, alternate sips/bites and remain upright after meals. Pill administration discussed and he prefers thin water over applesauce once starts taking pills. ST will continue to see for efficiency and reiterate recommendations. SLP Visit Diagnosis: Dysphagia, unspecified (R13.10)    Aspiration Risk  Mild aspiration risk     Diet Recommendation Dysphagia 3 (Mech soft);Thin liquid (Dys 3 per pt request)   Liquid Administration via: Cup;Straw Medication Administration: Whole meds with liquid Supervision: Patient able to self feed Compensations: Slow rate;Small sips/bites Postural Changes: Remain upright for at least 30 minutes after po intake;Seated upright at 90 degrees    Other  Recommendations Recommended Consults: Consider esophageal assessment Oral Care Recommendations: Oral care BID    Recommendations for follow up therapy are one component of a multi-disciplinary discharge planning process, led by the attending physician.  Recommendations may be updated based on patient status, additional functional criteria and insurance authorization.  Follow up Recommendations  (TBD)      Assistance Recommended at Discharge    Functional Status Assessment Patient has had a recent decline in their functional status and demonstrates the ability to make significant improvements in function in a reasonable and predictable amount of time.  Frequency and Duration min 2x/week  2 weeks       Prognosis Prognosis for Safe Diet Advancement: Good      Swallow Study   General Date of Onset: 09/22/22 HPI: 87 year old gentleman with history of previous smoking presented with more than 2 weeks of cough and fatigue, 50 pound weight loss for last 1 year, progressive weakness.  Chest x-ray showed complete opacification of the left hemithorax, CT scan of the chest showed large loculated left pleural effusion. Found to have severe sepsis with loculated empyema; chest tube placements. MD made pt NPO and ordered ST. Pt choked on water and admitted to swallow difficulty when cardiology PA in the room. Type of Study: Bedside Swallow Evaluation Previous Swallow Assessment:  (none) Diet Prior to this Study: NPO Temperature Spikes Noted: No  Respiratory Status: Nasal cannula History of Recent Intubation: No Behavior/Cognition:  Alert;Cooperative;Pleasant mood Oral Cavity Assessment: Within Functional Limits Oral Care Completed by SLP: No Oral Cavity - Dentition: Adequate natural dentition Vision: Functional for self-feeding Self-Feeding Abilities: Able to feed self Patient Positioning: Upright in bed Baseline Vocal Quality: Normal Volitional Cough: Strong Volitional Swallow: Able to elicit    Oral/Motor/Sensory Function Overall Oral Motor/Sensory Function: Within functional limits   Ice Chips Ice chips: Not tested   Thin Liquid Thin Liquid: Impaired Pharyngeal  Phase Impairments: Throat Clearing - Immediate;Throat Clearing - Delayed    Nectar Thick Nectar Thick Liquid: Not tested   Honey Thick Honey Thick Liquid: Not tested   Puree Puree: Within functional limits   Solid     Solid: Within functional limits      Houston Siren 09/23/2022,2:45 PM

## 2022-09-23 NOTE — Progress Notes (Signed)
PROGRESS NOTE    Ryan Reynolds  ZOX:[id number] DOB: 1935-12-30 DOA: 09/18/2022 PCP: Patient, No Pcp Per    Brief Narrative:  87 year old gentleman with history of previous smoking presented with more than 2 weeks of cough and fatigue, 50 pound weight loss for last 1 year.  Progressive weakness.  Patient does have a history of more than 60 pack/year of smoking.  He just quit 2 months ago.  In the emergency room blood pressures initially low responded to IV fluids.  Heart rate 114.  Temperature 100.9.  93% on room air.  WC count 25.5.  Magnesium was 1.3.  Chest x-ray showed complete opacification of the left hemithorax, CT scan of the chest showed large loculated left pleural effusion.  Case was discussed with pulmonary, recommended IR for diagnostic and therapeutic chest tube placement. 1/8, IR placed 3 pigtail catheters and drained more than 2 L of pus. Patient went into rapid a flutter but not symptomatic. Overnight blood pressures low, started on peripheral Levophed.  On room air.   Assessment & Plan:   Severe sepsis sepsis present on admission /loculated empyema:  Clinically stabilizing.  Blood pressures are adequate on minimal peripheral Levophed.  Hopefully we can taper it off.  Received multiple doses of fluid boluses and now continue on maintenance fluid. Patient desired not to have central line so we will continue peripheral Levophed. Blood cultures pending.  Fluid cultures pending.  On Rocephin and azithromycin, will add Flagyl given grossly purulent fluid. Chest physiotherapy, incentive spirometry, deep breathing exercises, sputum induction, mucolytic's and bronchodilators.  Mobilize out of bed today. Sputum cultures, blood cultures, Legionella and streptococcal antigen. Supplemental oxygen to keep saturations more than 90%. Repeat chest x-ray with much radiological improvement.  Chest tube is functioning well. Pulmonary following.  May need repeat CT scan once chest tubes are  out to evaluate for any underlying malignancy. Cytology pending.  New onset a flutter: Patient had been in and out of a flutter.  His heart rate was as high as 180.  Started on amiodarone because of low blood pressures.  Currently heart rate is controlled on amiodarone infusion.  Patient without any chest pain or palpitations. TSH, normal. 2D echocardiogram, pending today. No previous history of A-fib or stroke.  Holding off on anticoagulation due to immediate postop condition and transient nature of a flutter.  Will consult cardiology for ongoing management.  Hypomagnesemia: Replaced and adequate.  Thyroid nodule: Will need outpatient follow-up.  DVT prophylaxis: SCDs Start: 10/11/2022 2316   Code Status: DNR Family Communication: wife on the phone, called and updated. Disposition Plan: Status is: Inpatient Remains inpatient appropriate because: Inpatient procedures, IV antibiotics     Consultants:  Interventional radiology Cardiology PCCM  Procedures:  None  Antimicrobials:  Rocephin and azithromycin 1/7--- Flagyl 1/9---   Subjective: Patient seen and examined.  Overnight events noted.  Surprisingly patient tells me he had a comfortable night though they wanted to do many things on him he had no trouble.  He did not feel any dizziness lightheadedness palpitations or chest pain.  He was surprised to see that we have him on multiple medications to keep his heart rate under control and also to keep his blood pressures up. Used 1 dose of fentanyl last night and has not needed any medicine since then. Currently on Levophed 2 mcg/h Amiodarone 30 mg/h   Objective: Vitals:   09/23/22 0730 09/23/22 0745 09/23/22 0800 09/23/22 0815  BP: (!) 99/51 112/63 106/64 (!) 98/49  Pulse:  66 69 90 75  Resp: 20 (!) 24 (!) 22 (!) 22  Temp:   (!) 97.4 F (36.3 C)   TempSrc:   Axillary   SpO2: 98% 98% 97% 92%  Weight:      Height:        Intake/Output Summary (Last 24 hours) at  09/23/2022 1018 Last data filed at 09/23/2022 0800 Gross per 24 hour  Intake 5243.98 ml  Output 3700 ml  Net 1543.98 ml    Filed Weights   09/22/2022 1733  Weight: 82 kg    Examination:  General exam: Appears calm and comfortable on room air. Thin and frail.  Debilitated. Respiratory system: Poor air entry at left base. No added sounds.  Without any distress.  Looks very comfortable. All 3 chest tubes are draining purulent fluid.  Recorded output about 3600 from chest tubes.  SpO2: 92 % O2 Flow Rate (L/min): 5 L/min FiO2 (%): 100 %  Cardiovascular system: S1 & S2 heard, irregularly irregular.  Rate controlled. Gastrointestinal system: Abdomen is nondistended, soft and nontender. No organomegaly or masses felt. Normal bowel sounds heard. Central nervous system: Alert and oriented. No focal neurological deficits. Extremities: Symmetric 5 x 5 power. Skin: No rashes, lesions or ulcers Psychiatry: Judgement and insight appear normal. Mood & affect appropriate.     Data Reviewed: I have personally reviewed following labs and imaging studies  CBC: Recent Labs  Lab 09/23/2022 1752 09/22/22 0430 09/22/22 2137 09/23/22 0258  WBC 25.5* 26.7* 28.7* 20.8*  NEUTROABS  --   --   --  17.8*  HGB 11.9* 10.9* 10.6* 10.8*  HCT 37.5* 35.1* 36.7* 35.6*  MCV 90.8 92.1 99.7 96.7  PLT 510* 412* 430* 409*    Basic Metabolic Panel: Recent Labs  Lab 10/04/2022 1752 09/22/22 0430 09/22/22 2137 09/23/22 0258  NA 134* 136 140 137  K 5.1 4.9 5.0 4.6  CL 101 104 109 109  CO2 24 24 23  21*  GLUCOSE 188* 130* 104* 121*  BUN 50* 49* 47* 48*  CREATININE 1.38* 1.11 1.05 0.95  CALCIUM 9.6 8.9 8.5* 8.2*  MG 1.3* 1.7  --  2.1  PHOS  --   --   --  3.8    GFR: Estimated Creatinine Clearance: 63.5 mL/min (by C-G formula based on SCr of 0.95 mg/dL). Liver Function Tests: Recent Labs  Lab 10/08/2022 1752 09/22/22 0430 09/22/22 2137  AST 23 18 19   ALT 33 25 23  ALKPHOS 71 61 61  BILITOT 0.6 0.5 0.6   PROT 6.1* 5.2* 4.9*  ALBUMIN 2.0* 1.8* 1.7*    No results for input(s): "LIPASE", "AMYLASE" in the last 168 hours. No results for input(s): "AMMONIA" in the last 168 hours. Coagulation Profile: Recent Labs  Lab 09/22/22 0900  INR 1.4*    Cardiac Enzymes: No results for input(s): "CKTOTAL", "CKMB", "CKMBINDEX", "TROPONINI" in the last 168 hours. BNP (last 3 results) No results for input(s): "PROBNP" in the last 8760 hours. HbA1C: No results for input(s): "HGBA1C" in the last 72 hours. CBG: No results for input(s): "GLUCAP" in the last 168 hours. Lipid Profile: No results for input(s): "CHOL", "HDL", "LDLCALC", "TRIG", "CHOLHDL", "LDLDIRECT" in the last 72 hours. Thyroid Function Tests: Recent Labs    09/23/22 0258  TSH 0.673    Anemia Panel: No results for input(s): "VITAMINB12", "FOLATE", "FERRITIN", "TIBC", "IRON", "RETICCTPCT" in the last 72 hours. Sepsis Labs: Recent Labs  Lab 09/20/2022 2000 09/22/22 0430  PROCALCITON  --  0.61  LATICACIDVEN 1.9  --  Recent Results (from the past 240 hour(s))  Expectorated Sputum Assessment w Gram Stain, Rflx to Resp Cult     Status: None   Collection Time: 10/15/2022 11:42 AM   Specimen: Sputum  Result Value Ref Range Status   Specimen Description SPUTUM  Final   Special Requests NONE  Final   Sputum evaluation   Final    THIS SPECIMEN IS ACCEPTABLE FOR SPUTUM CULTURE Performed at [facility], [address], [place], [place] [address]    Report Status 09/22/2022 FINAL  Final  Culture, blood (single)     Status: None (Preliminary result)   Collection Time: 09/27/2022  5:54 PM   Specimen: BLOOD  Result Value Ref Range Status   Specimen Description   Final    BLOOD BLOOD LEFT ARM Performed at [facility], [address], [place], [place] [address]    Special Requests   Final    BOTTLES DRAWN AEROBIC AND ANAEROBIC Blood Culture results may not be optimal due to an inadequate  volume of blood received in culture bottles Performed at [facility], [address], [place], [place] [address]    Culture   Final    NO GROWTH 2 DAYS Performed at [facility], [address], [place], [place] [address]    Report Status PENDING  Incomplete  Culture, Respiratory w Gram Stain     Status: None (Preliminary result)   Collection Time: 09/22/22 11:45 AM   Specimen: SPU  Result Value Ref Range Status   Specimen Description   Final    SPUTUM Performed at [facility], [address] [place], [place] [address]    Special Requests   Final    NONE Reflexed from [id number] Performed at [facility], [address], [place], [place] [address]    Gram Stain   Final    NO WBC SEEN FEW GRAM POSITIVE COCCI IN CHAINS FEW GRAM POSITIVE RODS Performed at [facility], [address] [place], [place] [address]    Culture PENDING  Incomplete   Report Status PENDING  Incomplete  MRSA Next Gen by PCR, Nasal     Status: None   Collection Time: 09/22/22  2:42 PM   Specimen: Nasal Mucosa; Nasal Swab  Result Value Ref Range Status   MRSA by PCR Next Gen NOT DETECTED NOT DETECTED Final    Comment: (NOTE) The GeneXpert MRSA Assay (FDA approved for NASAL specimens only), is one component of a comprehensive MRSA colonization surveillance program. It is not intended to diagnose MRSA infection nor to guide or monitor treatment for MRSA infections. Test performance is not FDA approved in patients less than 23 years old. Performed at [facility], [address], [place], [place] [address]   Body fluid culture w Gram Stain     Status: None (Preliminary result)   Collection Time: 09/22/22  5:05 PM   Specimen: Pleural Fluid  Result Value Ref Range Status   Specimen Description   Final    PLEURAL Performed at [facility], [address], [place], [place] [address]    Special  Requests   Final    NONE Performed at [facility], [address] [place], [place] [address]    Gram Stain   Final    ABUNDANT WBC PRESENT, PREDOMINANTLY PMN ABUNDANT GRAM POSITIVE COCCI IN PAIRS IN CHAINS Gram Stain Report Called to,Read Back By and Verified  With: RN Astrid Divine [phone] @2328  FH    Culture   Final    NO GROWTH < 12 HOURS Performed at [facility], [address] [place], [place]    Report Status PENDING  Incomplete  Resp panel by RT-PCR (RSV, Flu A&B, Covid) Anterior Nasal Swab     Status: None   Collection Time: 09/22/22 11:10 PM   Specimen: Anterior Nasal Swab  Result Value Ref Range Status   SARS Coronavirus 2 by RT PCR NEGATIVE NEGATIVE Final    Comment: (NOTE) SARS-CoV-2 target nucleic acids are NOT DETECTED.  The SARS-CoV-2 RNA is generally detectable in upper respiratory specimens during the acute phase of infection. The lowest concentration of SARS-CoV-2 viral copies this assay can detect is 138 copies/mL. A negative result does not preclude SARS-Cov-2 infection and should not be used as the sole basis for treatment or other patient management decisions. A negative result may occur with  improper specimen collection/handling, submission of specimen other than nasopharyngeal swab, presence of viral mutation(s) within the areas targeted by this assay, and inadequate number of viral copies(<138 copies/mL). A negative result must be combined with clinical observations, patient history, and epidemiological information. The expected result is Negative.  Fact Sheet for Patients:  11/21/22  Fact Sheet for Healthcare Providers:  BloggerCourse.com  This test is no t yet approved or cleared by the [employer] FDA and  has been authorized for detection and/or diagnosis of SARS-CoV-2 by FDA under an Emergency Use Authorization (EUA). This EUA will remain  in effect  (meaning this test can be used) for the duration of the COVID-19 declaration under Section 564(b)(1) of the Act, 21 U.S.C.section 360bbb-3(b)(1), unless the authorization is terminated  or revoked sooner.       Influenza A by PCR NEGATIVE NEGATIVE Final   Influenza B by PCR NEGATIVE NEGATIVE Final    Comment: (NOTE) The Xpert Xpress SARS-CoV-2/FLU/RSV plus assay is intended as an aid in the diagnosis of influenza from Nasopharyngeal swab specimens and should not be used as a sole basis for treatment. Nasal washings and aspirates are unacceptable for Xpert Xpress SARS-CoV-2/FLU/RSV testing.  Fact Sheet for Patients: [place]  Fact Sheet for Healthcare Providers: BloggerCourse.com  This test is not yet approved or cleared by the [employer].[place] FDA and has been authorized for detection and/or diagnosis of SARS-CoV-2 by FDA under an Emergency Use Authorization (EUA). This EUA will remain in effect (meaning this test can be used) for the duration of the COVID-19 declaration under Section 564(b)(1) of the Act, 21 U.S.C. section 360bbb-3(b)(1), unless the authorization is terminated or revoked.     Resp Syncytial Virus by PCR NEGATIVE NEGATIVE Final    Comment: (NOTE) Fact Sheet for Patients: [place]  Fact Sheet for Healthcare Providers: BloggerCourse.com  This test is not yet approved or cleared by the [employer] FDA and has been authorized for detection and/or diagnosis of SARS-CoV-2 by FDA under an Emergency Use Authorization (EUA). This EUA will remain in effect (meaning this test can be used) for the duration of the COVID-19 declaration under Section 564(b)(1) of the Act, 21 U.S.C. section 360bbb-3(b)(1), unless the authorization is terminated or revoked.  Performed at [facility], [address] [place], [place]           Radiology Studies: DG CHEST PORT 1 VIEW  Result Date: 09/23/2022 CLINICAL DATA:  Pneumonia EXAM: PORTABLE CHEST 1 VIEW COMPARISON:  Radiograph 09/22/2022 FINDINGS: There are 2 left  apical chest tubes and a left basilar chest tube unchanged in position. Unchanged residual pleural effusion and adjacent atelectasis. Unchanged small right pleural effusion. Unchanged small lucency in the left peripheral lung base consistent with pneumothorax, and with adjacent pleural thickening. Thoracic spondylosis. Bones are unchanged. IMPRESSION: Unchanged left pleural effusion with small basilar gas component and multiple chest tubes in place. Unchanged adjacent atelectasis. Unchanged small right pleural effusion and atelectasis. Electronically Signed   By: Caprice Renshaw M.D.   On: 09/23/2022 08:23   DG CHEST PORT 1 VIEW  Result Date: 09/22/2022 CLINICAL DATA:  Chest tube placement. EXAM: PORTABLE CHEST 1 VIEW COMPARISON:  09/27/2022. FINDINGS: Three pigtail chest tubes have been inserted on the left, 1 projecting over the upper medial aspect of the hemithorax, another over the mid to upper lateral hemithorax and the third over the inferior, medial left hemithorax. There has been significant improvement with a notable decrease in opacity in the left mid and upper lung consistent with evacuation of loculated pleural effusions. Opacity at the left base has improved. Small left lateral lung base pneumothorax, likely ex vacuole. No apical pneumothorax. There is opacity at the right lung base that has developed since the previous day's study. This is likely a combination of a small effusion and atelectasis. Remainder of the right lung is clear. IMPRESSION: 1. Significant interval improvement in left lung aeration after placement of 3 left hemithorax chest tubes. 2. Small left inferior pneumothorax. 3. New opacity at the right lung base suspected to be a small effusion with atelectasis. Electronically Signed   By: Amie Portland M.D.   On: 09/22/2022 21:16   CT [facility] PLEURAL DRAIN W/INDWELL CATH W/IMG GUIDE  Result Date: 09/22/2022 INDICATION: 87 year old male with pneumonia and complex multiloculated left pleural effusion concerning for empyema. EXAM: CT-GUIDED CHEST TUBE PLACEMENT CT-GUIDED CHEST TUBE PLACEMENT CT-GUIDED CHEST TUBE PLACEMENT TECHNIQUE: Multidetector CT imaging of the CHEST was performed following the standard protocol WITHOUT IV contrast. RADIATION DOSE REDUCTION: This exam was performed according to the departmental dose-optimization program which includes automated exposure control, adjustment of the mA and/or kV according to patient size and/or use of iterative reconstruction technique. MEDICATIONS: The patient is currently admitted to the hospital and receiving intravenous antibiotics. The antibiotics were administered within an appropriate time frame prior to the initiation of the procedure. ANESTHESIA/SEDATION: Moderate (conscious) sedation was employed during this procedure. A total of Versed 1 mg and Fentanyl 50 mcg was administered intravenously by the radiology nurse. Total intra-service moderate Sedation Time: 58 minutes. The patient's level of consciousness and vital signs were monitored continuously by radiology nursing throughout the procedure under my direct supervision. COMPLICATIONS: None immediate. PROCEDURE: Informed written consent was obtained from the patient after a thorough discussion of the procedural risks, benefits and alternatives. All questions were addressed. Maximal Sterile Barrier Technique was utilized including caps, mask, sterile gowns, sterile gloves, sterile drape, hand hygiene and skin antiseptic. A timeout was performed prior to the initiation of the procedure. CHEST TUBE #1 A planning axial CT scan was performed. The largest fluid collection in the more inferior aspect of the lung was targeted. A suitable skin entry site was selected and marked. The skin was sterilely  prepped and draped in the standard fashion using chlorhexidine skin prep. Local anesthesia was attained by infiltration with 1% lidocaine. A small dermatotomy was made. A 22 gauge spinal needle was advanced and imaging obtained to confirm an appropriate trajectory between the ribs and into the fluid collection. Once this  was confirmed, a 31 [place] all-purpose drainage catheter modified with additional sideholes was advanced over the rib and into the pleural space using trocar technique. The catheter was connected to low wall suction via a Sahara device. Aspiration yields at least 800 mL of opaque foul-smelling purulent fluid. A sample was sent aside for Gram stain and culture. The catheter was secured to the skin with 0 silk suture and sterile bandages were applied. Repeat CT imaging demonstrates no decrease in the other 2 loculated fluid collections consistent with the suspicion the day are indeed completely separate. Therefore, plans were made to proceed with additional chest tube placement. CHEST TUBE #2 The posterior apical fluid collection was targeted next. A suitable skin entry site was selected and marked. The skin was sterilely prepped and draped in the standard fashion using chlorhexidine skin prep. Local anesthesia was attained by infiltration with 1% lidocaine. A small dermatotomy was made. A 22 gauge spinal needle was advanced and imaging obtained to confirm an appropriate trajectory between the ribs and into the fluid collection. Once this was confirmed, a 10 [place] all-purpose drainage catheter advanced over the rib and into the pleural space using trocar technique. The catheter was connected to low wall suction via a Sahara device. Aspiration yields 80 mL of opaque foul-smelling purulent bloody fluid. The catheter was secured to the skin with 0 silk suture and sterile bandages were applied. CHEST TUBE #3 The posterior apical fluid collection was targeted next. A suitable skin entry site was selected  and marked. The skin was sterilely prepped and draped in the standard fashion using chlorhexidine skin prep. Local anesthesia was attained by infiltration with 1% lidocaine. A small dermatotomy was made. A 22 gauge spinal needle was advanced and imaging obtained to confirm an appropriate trajectory between the ribs and into the fluid collection. Once this was confirmed, a 10 [place] all-purpose drainage catheter advanced over the rib and into the pleural space using trocar technique. Approximately 800 mL of foul-smelling purulent fluid was then aspirated using a 60 cc syringe and an abscess evacuation bag. The drainage catheter was then connected to waterseal. The catheter was secured to the skin with 0 silk suture and sterile bandages were applied. IMPRESSION: 1. Placement of a total of 3 percutaneous pigtail chest tubes into the left pleural space (Tube #1, a 12 [place] via the left lower lateral chest into the most inferior and largest collection; tube #2, 10 [place] in the posterior apex, Tube #3, 66F in the left upper anterior collection). 2. Overall, at least 1,800 mL of foul-smelling purulent fluid was aspirated between the 3 drainage catheters. A sample was sent for Gram stain and culture. Electronically Signed   By: Malachy Moan M.D.   On: 09/22/2022 17:59   CT [facility] PLEURAL DRAIN W/INDWELL CATH W/IMG GUIDE  Result Date: 09/22/2022 INDICATION: 87 year old male with pneumonia and complex multiloculated left pleural effusion concerning for empyema. EXAM: CT-GUIDED CHEST TUBE PLACEMENT CT-GUIDED CHEST TUBE PLACEMENT CT-GUIDED CHEST TUBE PLACEMENT TECHNIQUE: Multidetector CT imaging of the CHEST was performed following the standard protocol WITHOUT IV contrast. RADIATION DOSE REDUCTION: This exam was performed according to the departmental dose-optimization program which includes automated exposure control, adjustment of the mA and/or kV according to patient size and/or use of iterative reconstruction  technique. MEDICATIONS: The patient is currently admitted to the hospital and receiving intravenous antibiotics. The antibiotics were administered within an appropriate time frame prior to the initiation of the procedure. ANESTHESIA/SEDATION: Moderate (conscious) sedation was employed during this  procedure. A total of Versed 1 mg and Fentanyl 50 mcg was administered intravenously by the radiology nurse. Total intra-service moderate Sedation Time: 58 minutes. The patient's level of consciousness and vital signs were monitored continuously by radiology nursing throughout the procedure under my direct supervision. COMPLICATIONS: None immediate. PROCEDURE: Informed written consent was obtained from the patient after a thorough discussion of the procedural risks, benefits and alternatives. All questions were addressed. Maximal Sterile Barrier Technique was utilized including caps, mask, sterile gowns, sterile gloves, sterile drape, hand hygiene and skin antiseptic. A timeout was performed prior to the initiation of the procedure. CHEST TUBE #1 A planning axial CT scan was performed. The largest fluid collection in the more inferior aspect of the lung was targeted. A suitable skin entry site was selected and marked. The skin was sterilely prepped and draped in the standard fashion using chlorhexidine skin prep. Local anesthesia was attained by infiltration with 1% lidocaine. A small dermatotomy was made. A 22 gauge spinal needle was advanced and imaging obtained to confirm an appropriate trajectory between the ribs and into the fluid collection. Once this was confirmed, a 24 [place] all-purpose drainage catheter modified with additional sideholes was advanced over the rib and into the pleural space using trocar technique. The catheter was connected to low wall suction via a Sahara device. Aspiration yields at least 800 mL of opaque foul-smelling purulent fluid. A sample was sent aside for Gram stain and culture. The  catheter was secured to the skin with 0 silk suture and sterile bandages were applied. Repeat CT imaging demonstrates no decrease in the other 2 loculated fluid collections consistent with the suspicion the day are indeed completely separate. Therefore, plans were made to proceed with additional chest tube placement. CHEST TUBE #2 The posterior apical fluid collection was targeted next. A suitable skin entry site was selected and marked. The skin was sterilely prepped and draped in the standard fashion using chlorhexidine skin prep. Local anesthesia was attained by infiltration with 1% lidocaine. A small dermatotomy was made. A 22 gauge spinal needle was advanced and imaging obtained to confirm an appropriate trajectory between the ribs and into the fluid collection. Once this was confirmed, a 10 [place] all-purpose drainage catheter advanced over the rib and into the pleural space using trocar technique. The catheter was connected to low wall suction via a Sahara device. Aspiration yields 80 mL of opaque foul-smelling purulent bloody fluid. The catheter was secured to the skin with 0 silk suture and sterile bandages were applied. CHEST TUBE #3 The posterior apical fluid collection was targeted next. A suitable skin entry site was selected and marked. The skin was sterilely prepped and draped in the standard fashion using chlorhexidine skin prep. Local anesthesia was attained by infiltration with 1% lidocaine. A small dermatotomy was made. A 22 gauge spinal needle was advanced and imaging obtained to confirm an appropriate trajectory between the ribs and into the fluid collection. Once this was confirmed, a 10 [place] all-purpose drainage catheter advanced over the rib and into the pleural space using trocar technique. Approximately 800 mL of foul-smelling purulent fluid was then aspirated using a 60 cc syringe and an abscess evacuation bag. The drainage catheter was then connected to waterseal. The catheter was  secured to the skin with 0 silk suture and sterile bandages were applied. IMPRESSION: 1. Placement of a total of 3 percutaneous pigtail chest tubes into the left pleural space (Tube #1, a 12 [place] via the left lower lateral chest into  the most inferior and largest collection; tube #2, 10 French in the posterior apex, Tube #3, 77F in the left upper anterior collection). 2. Overall, at least 1,800 mL of foul-smelling purulent fluid was aspirated between the 3 drainage catheters. A sample was sent for Gram stain and culture. Electronically Signed   By: Malachy MoanHeath  McCullough M.D.   On: 09/22/2022 17:59   [facility]/IMG GUIDE  Result Date: 09/22/2022 INDICATION: 87 year old male with pneumonia and complex multiloculated left pleural effusion concerning for empyema. EXAM: CT-GUIDED CHEST TUBE PLACEMENT CT-GUIDED CHEST TUBE PLACEMENT CT-GUIDED CHEST TUBE PLACEMENT TECHNIQUE: Multidetector CT imaging of the CHEST was performed following the standard protocol WITHOUT IV contrast. RADIATION DOSE REDUCTION: This exam was performed according to the departmental dose-optimization program which includes automated exposure control, adjustment of the mA and/or kV according to patient size and/or use of iterative reconstruction technique. MEDICATIONS: The patient is currently admitted to the hospital and receiving intravenous antibiotics. The antibiotics were administered within an appropriate time frame prior to the initiation of the procedure. ANESTHESIA/SEDATION: Moderate (conscious) sedation was employed during this procedure. A total of Versed 1 mg and Fentanyl 50 mcg was administered intravenously by the radiology nurse. Total intra-service moderate Sedation Time: 58 minutes. The patient's level of consciousness and vital signs were monitored continuously by radiology nursing throughout the procedure under my direct supervision. COMPLICATIONS: None immediate. PROCEDURE: Informed written consent was  obtained from the patient after a thorough discussion of the procedural risks, benefits and alternatives. All questions were addressed. Maximal Sterile Barrier Technique was utilized including caps, mask, sterile gowns, sterile gloves, sterile drape, hand hygiene and skin antiseptic. A timeout was performed prior to the initiation of the procedure. CHEST TUBE #1 A planning axial CT scan was performed. The largest fluid collection in the more inferior aspect of the lung was targeted. A suitable skin entry site was selected and marked. The skin was sterilely prepped and draped in the standard fashion using chlorhexidine skin prep. Local anesthesia was attained by infiltration with 1% lidocaine. A small dermatotomy was made. A 22 gauge spinal needle was advanced and imaging obtained to confirm an appropriate trajectory between the ribs and into the fluid collection. Once this was confirmed, a 5612 JamaicaFrench all-purpose drainage catheter modified with additional sideholes was advanced over the rib and into the pleural space using trocar technique. The catheter was connected to low wall suction via a Sahara device. Aspiration yields at least 800 mL of opaque foul-smelling purulent fluid. A sample was sent aside for Gram stain and culture. The catheter was secured to the skin with 0 silk suture and sterile bandages were applied. Repeat CT imaging demonstrates no decrease in the other 2 loculated fluid collections consistent with the suspicion the day are indeed completely separate. Therefore, plans were made to proceed with additional chest tube placement. CHEST TUBE #2 The posterior apical fluid collection was targeted next. A suitable skin entry site was selected and marked. The skin was sterilely prepped and draped in the standard fashion using chlorhexidine skin prep. Local anesthesia was attained by infiltration with 1% lidocaine. A small dermatotomy was made. A 22 gauge spinal needle was advanced and imaging obtained to  confirm an appropriate trajectory between the ribs and into the fluid collection. Once this was confirmed, a 10 [place] all-purpose drainage catheter advanced over the rib and into the pleural space using trocar technique. The catheter was connected to low wall suction via a Sahara device. Aspiration yields  80 mL of opaque foul-smelling purulent bloody fluid. The catheter was secured to the skin with 0 silk suture and sterile bandages were applied. CHEST TUBE #3 The posterior apical fluid collection was targeted next. A suitable skin entry site was selected and marked. The skin was sterilely prepped and draped in the standard fashion using chlorhexidine skin prep. Local anesthesia was attained by infiltration with 1% lidocaine. A small dermatotomy was made. A 22 gauge spinal needle was advanced and imaging obtained to confirm an appropriate trajectory between the ribs and into the fluid collection. Once this was confirmed, a 10 [place] all-purpose drainage catheter advanced over the rib and into the pleural space using trocar technique. Approximately 800 mL of foul-smelling purulent fluid was then aspirated using a 60 cc syringe and an abscess evacuation bag. The drainage catheter was then connected to waterseal. The catheter was secured to the skin with 0 silk suture and sterile bandages were applied. IMPRESSION: 1. Placement of a total of 3 percutaneous pigtail chest tubes into the left pleural space (Tube #1, a 12 [place] via the left lower lateral chest into the most inferior and largest collection; tube #2, 10 [place] in the posterior apex, Tube #3, 62F in the left upper anterior collection). 2. Overall, at least 1,800 mL of foul-smelling purulent fluid was aspirated between the 3 drainage catheters. A sample was sent for Gram stain and culture. Electronically Signed   By: Malachy Moan M.D.   On: 09/22/2022 17:59   CT CHEST WO CONTRAST  Result Date: 10/12/2022 CLINICAL DATA:  Pneumonia complication  suspected. X-ray done showing pleural effusion. Malignancy suspected. Congestion and cough EXAM: CT CHEST WITHOUT CONTRAST TECHNIQUE: Multidetector CT imaging of the chest was performed following the standard protocol without IV contrast. RADIATION DOSE REDUCTION: This exam was performed according to the departmental dose-optimization program which includes automated exposure control, adjustment of the mA and/or kV according to patient size and/or use of iterative reconstruction technique. COMPARISON:  Radiographs earlier today FINDINGS: Cardiovascular: Normal heart size. Small pericardial effusion. Aortic valve and mitral annular calcification. Coronary artery and aortic atherosclerotic calcification. Mediastinum/Nodes: 1.9 cm left thyroid nodule. Unremarkable esophagus. No definite thoracic adenopathy on noncontrast exam. Lungs/Pleura: Layering secretions in the trachea extending into the left mainstem bronchus where there is near occlusion. Mucous plugging throughout the left lingular and lower lobe bronchi. Mild diffuse bronchial wall thickening. Large loculated pleural effusion on the left. There is suggestion of wall thickening about the loculated effusions compatible with empyemas though this is incompletely evaluated without IV contrast. Near-complete collapse of both the left upper and lower lobes. Right posterior lower atelectasis/scarring and pleural thickening. Emphysema. Upper Abdomen: Poorly evaluated.  No definite acute abnormality. Musculoskeletal: No acute osseous abnormality. IMPRESSION: Large loculated left pleural effusion. This is suspicious for empyemas though is incompletely evaluated without IV contrast. Malignant effusion is not excluded. Large amount of secretions within the trachea and left lung bronchi with postobstructive atelectasis/pneumonia. Coronary artery atherosclerosis. Aortic Atherosclerosis (ICD10-I70.0) and Emphysema (ICD10-J43.9). 1.9 cm left thyroid nodule. Nonemergent  ultrasound could be performed if clinically indicated given comorbidities and life expectancy. Electronically Signed   By: Minerva Fester M.D.   On: 12-Oct-2022 20:36   DG Chest Port 1 View  Result Date: 2022/10/12 CLINICAL DATA:  New onset AFib EXAM: PORTABLE CHEST 1 VIEW COMPARISON:  None. FINDINGS: Cardiac silhouette appears enlarged. There is complete opacification of the left hemithorax. Right lung is clear. No pneumothorax. No acute fractures. IMPRESSION: Complete opacification of the left  hemithorax, likely combination of pleural effusion and atelectasis/consolidation. Electronically Signed   By: Darliss Cheney M.D.   On: 10/01/2022 18:36        Scheduled Meds:  Chlorhexidine Gluconate Cloth  6 each Topical Daily   sodium chloride flush  10 mL Intrapleural Q8H   sodium chloride flush  3 mL Intravenous Q12H   Continuous Infusions:  sodium chloride 125 mL/hr at 09/23/22 0800   sodium chloride 10 mL/hr at 09/23/22 0800   amiodarone 30 mg/hr (09/23/22 0800)   azithromycin Stopped (09/22/22 2116)   cefTRIAXone (ROCEPHIN)  IV Stopped (09/22/22 2243)   metronidazole 500 mg (09/23/22 0942)   norepinephrine (LEVOPHED) Adult infusion 2 mcg/min (09/23/22 0800)     LOS: 2 days    Time spent: 35 minutes    Dorcas Carrow, MD [facility] Pager [phone]

## 2022-09-23 NOTE — Progress Notes (Signed)
Echocardiogram 2D Echocardiogram has been performed.  Ryan Reynolds 09/23/2022, 4:23 PM

## 2022-09-23 NOTE — Progress Notes (Signed)
[facility] PCCM PROGRESS NOTE  S:87 year old admitted with empyema s/p chest tube x 3 placed yesterday. Draining a significant amount of purulent fluid since that time. Overnight PCCM was consulted for new vasopressor need and chest tube management.   O: BP (!) 92/55   Pulse (!) 59   Temp (!) 97.4 F (36.3 C) (Axillary)   Resp (!) 25   Ht 6' (1.829 m)   Wt 82 kg   SpO2 98%   BMI 24.52 kg/m   General:  frail elderly gentleman in NAD Neuro:  Alert, oriented, non-focal HEENT:  [place]/AT, PERRL, no JVD. MM dry Cardiovascular:  atrial flutter on monitor not rate controlled.  Lungs:  Clear on the R, diminished on the left.  Abdomen:  Soft, non-tender, non-distended Musculoskeletal:  No acute deformity Skin:  Intact, MMM   A/P:  Hypotension: only really started after drains were placed. Suspect this is due to hypovolemia considering dry mucous membranes and significant volume loss from pleural space. Albumin 1.5 so may also be third spacing to pleural space. Cannot rule out sepsis. - Continue NE infusion for MAP goal 60 assuming no symptoms of hypotension - Continue MIVF - patient has deferred CVL placement. Now only on 0.5 mcg NE so will hold off.  - Consider albumin administration.   Empyema: by glucose < 20.  Cultures so far growing GPC.  - Continue chest tubes (3) to suction - Perhaps tomorrow if drainage drops off we can re-CT his chest to see what pockets are best targets for pleural lytics. Age and overall frailty would make him a less than ideal surgical candidate.  - MRSA swab negative so OK with CTX/azith/flagyl pending maturation of cultures.    Georgann Housekeeper, AGACNP-BC [facility]  See Amion for personal pager PCCM on call pager [phone] until 7pm. Please call Elink 7p-7a. [phone]  09/23/2022 10:54 AM

## 2022-09-24 ENCOUNTER — Inpatient Hospital Stay (HOSPITAL_COMMUNITY): Payer: Medicare Other

## 2022-09-24 DIAGNOSIS — R6520 Severe sepsis without septic shock: Secondary | ICD-10-CM | POA: Diagnosis not present

## 2022-09-24 DIAGNOSIS — A419 Sepsis, unspecified organism: Secondary | ICD-10-CM | POA: Diagnosis not present

## 2022-09-24 DIAGNOSIS — R1310 Dysphagia, unspecified: Secondary | ICD-10-CM | POA: Diagnosis not present

## 2022-09-24 DIAGNOSIS — J9601 Acute respiratory failure with hypoxia: Secondary | ICD-10-CM | POA: Diagnosis not present

## 2022-09-24 DIAGNOSIS — I4892 Unspecified atrial flutter: Secondary | ICD-10-CM | POA: Diagnosis not present

## 2022-09-24 DIAGNOSIS — J90 Pleural effusion, not elsewhere classified: Secondary | ICD-10-CM | POA: Diagnosis not present

## 2022-09-24 LAB — CBC WITH DIFFERENTIAL/PLATELET
Abs Immature Granulocytes: 0.41 10*3/uL — ABNORMAL HIGH (ref 0.00–0.07)
Basophils Absolute: 0.1 10*3/uL (ref 0.0–0.1)
Basophils Relative: 1 %
Eosinophils Absolute: 0 10*3/uL (ref 0.0–0.5)
Eosinophils Relative: 0 %
HCT: 33.9 % — ABNORMAL LOW (ref 39.0–52.0)
Hemoglobin: 10.1 g/dL — ABNORMAL LOW (ref 13.0–17.0)
Immature Granulocytes: 3 %
Lymphocytes Relative: 5 %
Lymphs Abs: 0.7 10*3/uL (ref 0.7–4.0)
MCH: 28.4 pg (ref 26.0–34.0)
MCHC: 29.8 g/dL — ABNORMAL LOW (ref 30.0–36.0)
MCV: 95.2 fL (ref 80.0–100.0)
Monocytes Absolute: 0.9 10*3/uL (ref 0.1–1.0)
Monocytes Relative: 6 %
Neutro Abs: 12 10*3/uL — ABNORMAL HIGH (ref 1.7–7.7)
Neutrophils Relative %: 85 %
Platelets: 336 10*3/uL (ref 150–400)
RBC: 3.56 MIL/uL — ABNORMAL LOW (ref 4.22–5.81)
RDW: 13.6 % (ref 11.5–15.5)
WBC: 14.1 10*3/uL — ABNORMAL HIGH (ref 4.0–10.5)
nRBC: 0 % (ref 0.0–0.2)

## 2022-09-24 LAB — COMPREHENSIVE METABOLIC PANEL
ALT: 23 U/L (ref 0–44)
AST: 22 U/L (ref 15–41)
Albumin: <1.5 g/dL — ABNORMAL LOW (ref 3.5–5.0)
Alkaline Phosphatase: 49 U/L (ref 38–126)
Anion gap: 6 (ref 5–15)
BUN: 40 mg/dL — ABNORMAL HIGH (ref 8–23)
CO2: 21 mmol/L — ABNORMAL LOW (ref 22–32)
Calcium: 8 mg/dL — ABNORMAL LOW (ref 8.9–10.3)
Chloride: 110 mmol/L (ref 98–111)
Creatinine, Ser: 0.91 mg/dL (ref 0.61–1.24)
GFR, Estimated: >60 mL/min (ref >60)
Glucose, Bld: 97 mg/dL (ref 70–99)
Potassium: 4.3 mmol/L (ref 3.5–5.1)
Sodium: 137 mmol/L (ref 135–145)
Total Bilirubin: 0.4 mg/dL (ref 0.3–1.2)
Total Protein: 4.4 g/dL — ABNORMAL LOW (ref 6.5–8.1)

## 2022-09-24 LAB — MAGNESIUM: Magnesium: 1.6 mg/dL — ABNORMAL LOW (ref 1.7–2.4)

## 2022-09-24 LAB — CYTOLOGY - NON PAP

## 2022-09-24 MED ORDER — MAGNESIUM SULFATE 2 GM/50ML IV SOLN
2.0000 g | Freq: Once | INTRAVENOUS | Status: AC
  Administered 2022-09-24: 2 g via INTRAVENOUS
  Filled 2022-09-24: qty 50

## 2022-09-24 MED ORDER — SODIUM CHLORIDE 0.9% FLUSH
10.0000 mL | Freq: Three times a day (TID) | INTRAVENOUS | Status: DC
  Administered 2022-09-24 – 2022-09-26 (×8): 10 mL via INTRAPLEURAL

## 2022-09-24 MED ORDER — STERILE WATER FOR INJECTION IJ SOLN
5.0000 mg | Freq: Once | RESPIRATORY_TRACT | Status: AC
  Administered 2022-09-24: 5 mg via INTRAPLEURAL
  Filled 2022-09-24: qty 5

## 2022-09-24 MED ORDER — LIP MEDEX EX OINT
TOPICAL_OINTMENT | CUTANEOUS | Status: DC | PRN
  Filled 2022-09-24: qty 7

## 2022-09-24 MED ORDER — SODIUM CHLORIDE (PF) 0.9 % IJ SOLN
10.0000 mg | Freq: Once | INTRAMUSCULAR | Status: AC
  Administered 2022-09-24: 10 mg via INTRAPLEURAL
  Filled 2022-09-24: qty 10

## 2022-09-24 MED ORDER — PHENOL 1.4 % MT LIQD
1.0000 | OROMUCOSAL | Status: DC | PRN
  Filled 2022-09-24: qty 177

## 2022-09-24 MED ORDER — AMIODARONE HCL 200 MG PO TABS
400.0000 mg | ORAL_TABLET | Freq: Two times a day (BID) | ORAL | Status: DC
  Administered 2022-09-24 – 2022-09-27 (×7): 400 mg via ORAL
  Filled 2022-09-24 (×8): qty 2

## 2022-09-24 NOTE — Progress Notes (Signed)
   NAME:  Ryan Reynolds, MRN:  [id number], DOB:  May 27, 1936, LOS: 3 ADMISSION DATE:  10/21/2022, CONSULTATION DATE:  09/23/22 REFERRING MD:  TRH, CHIEF COMPLAINT:  hypotension   History of Present Illness:  87 yo with no known pmh with exception of tobacoo use per chart review presents with cough and fatigue found to have suspected empyema, pneumonia and ? Of underlying malignancy. Pt was started on empiric abx, IR consulted and pt had 3 chest tubes placed with large volume output over past 24 hours. He was taken to [facility] for close monitoring when he began hypotensive for which CCM was consulted today.   Upon evaluation pt was on 51mcg of levo (previously 9) and alert oriented. He declined CVC placement stating that he feels we are on the "upswing" and reiterating his "DNR" status.  Cx and cytology pending for pleural fluid. Cont volume resuscitation as well as abx therapy.   He denies complaints stating that his chest pain actually resolved with chest tube placement, no fever chills, slight cough but endorses it is improved from prior to presentation as well.   Pertinent  Medical History  Tobacco use (ceassation 11/23)  Significant Hospital Events: Including procedures, antibiotic start and stop dates in addition to other pertinent events   Admitted 21-Oct-2022 Chest tube placed 01/08 Ccm consulted 01/09  Interim History / Subjective:  Levo off   Says he feels maybe a little less dyspneic today   SLP with suspicion for esophageal dysphagia, mild aspiration risk, started on dysphagia 3 diet  Objective   Blood pressure (!) 110/56, pulse 65, temperature 97.6 F (36.4 C), temperature source Oral, resp. rate 20, height 6' (1.829 m), weight 82 kg, SpO2 94 %.        Intake/Output Summary (Last 24 hours) at 09/24/2022 1046 Last data filed at 09/24/2022 0900 Gross per 24 hour  Intake 3314.17 ml  Output 921 ml  Net 2393.17 ml   Filed Weights   21-Oct-2022 1733  Weight: 82 kg     Examination: General appearance: 87 y.o., male, frail Eyes: tracking appropriately HENT: NCAT; MMM Lungs: rhonchi bl, with normal respiratory effort CV: IRIR, no murmur  Abdomen: Soft, non-tender; non-distended, BS present  Extremities: No peripheral edema, warm Skin: Normal turgor and texture; no rash Neuro: grossly nonfocal  Tube 1 and 2 tidaling, tube 2 with purulent drainage. No air leaks.  Cx from pleural fluid with GPC in pairs/chains  CXR with some reaccumulation of left effusion  Resolved Hospital Problem list   Shock resolved  Assessment & Plan:    Left empyema s/p placement of 3 pigtail chest tubes 1/8:  Left mainstem endobronchial occlusion: -would continue at least ceftriaxone for now, anticipate need for 4 week course of ABX -cyto pending -1st dose lytics today -CXR tomorrow -repeat CT Chest following drainage -agree with flutter valve, SLP evaluation. Can also try to position left lung up for postural drainage but need to ensure care to not dislodge tubes.   Best Practice (right click and "Reselect all SmartList Selections" daily)   Per primary  Critical care time: 60min

## 2022-09-24 NOTE — Progress Notes (Signed)
  Transition of Care [facility]) Screening Note   Patient Details  Name: ERNESTINE LANGWORTHY Date of Birth: [birth year]   [facility] CM/SW Contact:    Roseanne Kaufman, RN Phone Number: 09/24/2022, 3:34 PM    Transition of Care Department [facility]) has reviewed patient and no TOC needs have been identified at this time. We will continue to monitor patient advancement through interdisciplinary progression rounds. If new patient transition needs arise, please place a TOC consult.

## 2022-09-24 NOTE — Progress Notes (Signed)
Referring Physician(s): [place]  Supervising Physician: Michaelle Birks  Patient Status:  [facility] - In-pt  Chief Complaint: empyema   Subjective: Pt states that his breathing is "about the same"; denies fever but requesting more blankets to cover himself; some tenderness at left chest tubes as expected   Allergies: Patient has no known allergies.  Medications: Prior to Admission medications   Medication Sig Start Date End Date Taking? Authorizing Provider  acetaminophen (TYLENOL) 325 MG tablet Take 650 mg by mouth every 6 (six) hours as needed for mild pain.   Yes [provider]  Multiple Vitamin (MULTIVITAMIN WITH MINERALS) TABS tablet Take 1 tablet by mouth daily.   Yes [provider]     Vital Signs: BP (!) 111/58   Pulse 68   Temp (!) 97.3 F (36.3 C) (Axillary)   Resp (!) 21   Ht 6' (1.829 m)   Wt 180 lb 12.4 oz (82 kg)   SpO2 99%   BMI 24.52 kg/m   Physical Exam awake, answers simple questions ok; 3 left chest tubes in place; purulent beige fluid/ some serosanginous in pleuravac; no obvious air leaks  Imaging: DG CHEST PORT 1 VIEW  Result Date: 09/24/2022 CLINICAL DATA:  [id number] with empyema, chest tubes. EXAM: PORTABLE CHEST 1 VIEW COMPARISON:  Portable chest yesterday at 7:54 a.m., chest CT 10/12/2022 without contrast. FINDINGS: 4:12 a.m. There are 3 left pigtail chest tube stable in positioning. No visible pneumothorax. There is increased opacity in the left lower chest which could be due to increasing consolidation or reaccumulating pleural fluid. There is increasing haziness along side the chest tube in the lateral left upper to mid lung field which could also be due to airspace disease or accumulating fluid. On the right, there is increased small to moderate pleural effusion and increased patchy atelectasis or consolidation in the right lower lung field. The cardiac size is stable. There is mild central vascular fullness without overt edema.  Right upper lung field remains clear. Mild S shaped thoracic scoliosis. IMPRESSION: 1. Increased opacity in the left lower chest which could be due to increasing consolidation or reaccumulating pleural fluid. 2. Increased haziness along side the chest tube in the lateral left upper to mid lung field which could also be due to airspace disease or reaccumulating fluid. 3. Increased small to moderate right pleural effusion and increased patchy atelectasis or consolidation in the right lower lung field. Electronically Signed   By: Telford Nab M.D.   On: 09/24/2022 06:38   ECHOCARDIOGRAM COMPLETE  Result Date: 09/23/2022    ECHOCARDIOGRAM REPORT   Patient Name:   Ryan Reynolds Date of Exam: 09/23/2022 Medical Rec #:  [id number]        Height:       72.0 in Accession #:    [id number]       Weight:       180.8 lb Date of Birth:  1935-11-22       BSA:          2.041 m Patient Age:    87 years         BP:           98/57 mmHg Patient Gender: M                HR:           64 bpm. Exam Location:  Inpatient Procedure: 2D Echo, Cardiac Doppler and Color Doppler Indications:    R94.31 Abnormal EKG  History:        Patient has no prior history of Echocardiogram examinations.                 Arrythmias:Atrial Fibrillation.  Sonographer:    Bernadene Person RDCS Referring Phys: [id number] [facility]  Sonographer Comments: Image acquisition challenging due to patient body habitus. IMPRESSIONS  1. Left ventricular ejection fraction, by estimation, is 60 to 65%. The left ventricle has normal function. The left ventricle has no regional wall motion abnormalities. Left ventricular diastolic function could not be evaluated.  2. Right ventricular systolic function is normal. The right ventricular size is mildly enlarged. There is mildly elevated pulmonary artery systolic pressure. The estimated right ventricular systolic pressure is [id number] mmHg.  3. Right atrial size was mildly dilated.  4. The mitral valve is degenerative. Mild mitral  valve regurgitation. No evidence of mitral stenosis. Moderate mitral annular calcification.  5. The aortic valve is tricuspid. There is moderate calcification of the aortic valve. There is moderate thickening of the aortic valve. Aortic valve regurgitation is trivial. Mild aortic valve stenosis. Aortic valve area, by VTI measures 2.51 cm. Aortic valve mean gradient measures 10.0 mmHg. Aortic valve Vmax measures 2.23 m/s.  6. There is mild dilatation of the ascending aorta, measuring 41 mm.  7. The inferior vena cava is dilated in size with <50% respiratory variability, suggesting right atrial pressure of 15 mmHg. FINDINGS  Left Ventricle: Left ventricular ejection fraction, by estimation, is 60 to 65%. The left ventricle has normal function. The left ventricle has no regional wall motion abnormalities. The left ventricular internal cavity size was normal in size. There is  no left ventricular hypertrophy. Left ventricular diastolic function could not be evaluated due to atrial fibrillation. Left ventricular diastolic function could not be evaluated. Right Ventricle: The right ventricular size is mildly enlarged. No increase in right ventricular wall thickness. Right ventricular systolic function is normal. There is mildly elevated pulmonary artery systolic pressure. The tricuspid regurgitant velocity is 2.58 m/s, and with an assumed right atrial pressure of 15 mmHg, the estimated right ventricular systolic pressure is 99991111 mmHg. Left Atrium: Left atrial size was normal in size. Right Atrium: Right atrial size was mildly dilated. Pericardium: Trivial pericardial effusion is present. Mitral Valve: The mitral valve is degenerative in appearance. Moderate mitral annular calcification. Mild mitral valve regurgitation. No evidence of mitral valve stenosis. Tricuspid Valve: The tricuspid valve is grossly normal. Tricuspid valve regurgitation is mild . No evidence of tricuspid stenosis. Aortic Valve: The aortic valve is  tricuspid. There is moderate calcification of the aortic valve. There is moderate thickening of the aortic valve. Aortic valve regurgitation is trivial. Mild aortic stenosis is present. Aortic valve mean gradient measures 10.0 mmHg. Aortic valve peak gradient measures 19.9 mmHg. Aortic valve area, by VTI measures 2.51 cm. Pulmonic Valve: The pulmonic valve was grossly normal. Pulmonic valve regurgitation is trivial. No evidence of pulmonic stenosis. Aorta: The aortic root is normal in size and structure. There is mild dilatation of the ascending aorta, measuring 41 mm. Venous: The inferior vena cava is dilated in size with less than 50% respiratory variability, suggesting right atrial pressure of 15 mmHg. IAS/Shunts: The atrial septum is grossly normal.  LEFT VENTRICLE PLAX 2D LVIDd:         3.50 cm   Diastology LVIDs:         1.80 cm   LV e' medial:    9.20 cm/s LV PW:  1.00 cm   LV E/e' medial:  15.4 LV IVS:        1.00 cm   LV e' lateral:   9.11 cm/s LVOT diam:     2.30 cm   LV E/e' lateral: 15.6 LV SV:         114 LV SV Index:   56 LVOT Area:     4.15 cm  RIGHT VENTRICLE RV S prime:     14.70 cm/s TAPSE (M-mode): 2.0 cm LEFT ATRIUM           Index        RIGHT ATRIUM           Index LA diam:      4.40 cm 2.16 cm/m   RA Area:     18.10 cm LA Vol (A2C): 46.5 ml 22.78 ml/m  RA Volume:   44.70 ml  21.90 ml/m LA Vol (A4C): 57.1 ml 27.98 ml/m  AORTIC VALVE AV Area (Vmax):    2.36 cm AV Area (Vmean):   2.26 cm AV Area (VTI):     2.51 cm AV Vmax:           223.00 cm/s AV Vmean:          149.000 cm/s AV VTI:            0.455 m AV Peak Grad:      19.9 mmHg AV Mean Grad:      10.0 mmHg LVOT Vmax:         126.50 cm/s LVOT Vmean:        81.050 cm/s LVOT VTI:          0.274 m LVOT/AV VTI ratio: 0.60  AORTA Ao Root diam: 3.70 cm Ao Asc diam:  4.10 cm MITRAL VALVE                TRICUSPID VALVE MV Area (PHT): 4.39 cm     TR Peak grad:   26.6 mmHg MV Decel Time: 173 msec     TR Vmax:        258.00 cm/s MR Peak  grad: 30.1 mmHg MR Mean grad: 23.0 mmHg     SHUNTS MR Vmax:      274.50 cm/s   Systemic VTI:  0.27 m MR Vmean:     233.0 cm/s    Systemic Diam: 2.30 cm MV E velocity: 142.00 cm/s MV A velocity: 50.70 cm/s MV E/A ratio:  2.80 Eleonore Chiquito MD Electronically signed by Eleonore Chiquito MD Signature Date/Time: 09/23/2022/4:44:56 PM    Final    DG CHEST PORT 1 VIEW  Result Date: 09/23/2022 CLINICAL DATA:  Pneumonia EXAM: PORTABLE CHEST 1 VIEW COMPARISON:  Radiograph 09/22/2022 FINDINGS: There are 2 left apical chest tubes and a left basilar chest tube unchanged in position. Unchanged residual pleural effusion and adjacent atelectasis. Unchanged small right pleural effusion. Unchanged small lucency in the left peripheral lung base consistent with pneumothorax, and with adjacent pleural thickening. Thoracic spondylosis. Bones are unchanged. IMPRESSION: Unchanged left pleural effusion with small basilar gas component and multiple chest tubes in place. Unchanged adjacent atelectasis. Unchanged small right pleural effusion and atelectasis. Electronically Signed   By: Maurine Simmering M.D.   On: 09/23/2022 08:23   DG CHEST PORT 1 VIEW  Result Date: 09/22/2022 CLINICAL DATA:  Chest tube placement. EXAM: PORTABLE CHEST 1 VIEW COMPARISON:  10/09/2022. FINDINGS: Three pigtail chest tubes have been inserted on the left, 1 projecting over the upper medial aspect of the hemithorax,  another over the mid to upper lateral hemithorax and the third over the inferior, medial left hemithorax. There has been significant improvement with a notable decrease in opacity in the left mid and upper lung consistent with evacuation of loculated pleural effusions. Opacity at the left base has improved. Small left lateral lung base pneumothorax, likely ex vacuole. No apical pneumothorax. There is opacity at the right lung base that has developed since the previous day's study. This is likely a combination of a small effusion and atelectasis. Remainder of  the right lung is clear. IMPRESSION: 1. Significant interval improvement in left lung aeration after placement of 3 left hemithorax chest tubes. 2. Small left inferior pneumothorax. 3. New opacity at the right lung base suspected to be a small effusion with atelectasis. Electronically Signed   By: Lajean Manes M.D.   On: 09/22/2022 21:16   CT [facility] PLEURAL DRAIN W/INDWELL CATH W/IMG GUIDE  Result Date: 09/22/2022 INDICATION: 87 year old male with pneumonia and complex multiloculated left pleural effusion concerning for empyema. EXAM: CT-GUIDED CHEST TUBE PLACEMENT CT-GUIDED CHEST TUBE PLACEMENT CT-GUIDED CHEST TUBE PLACEMENT TECHNIQUE: Multidetector CT imaging of the CHEST was performed following the standard protocol WITHOUT IV contrast. RADIATION DOSE REDUCTION: This exam was performed according to the departmental dose-optimization program which includes automated exposure control, adjustment of the mA and/or kV according to patient size and/or use of iterative reconstruction technique. MEDICATIONS: The patient is currently admitted to the hospital and receiving intravenous antibiotics. The antibiotics were administered within an appropriate time frame prior to the initiation of the procedure. ANESTHESIA/SEDATION: Moderate (conscious) sedation was employed during this procedure. A total of Versed 1 mg and Fentanyl 50 mcg was administered intravenously by the radiology nurse. Total intra-service moderate Sedation Time: 58 minutes. The patient's level of consciousness and vital signs were monitored continuously by radiology nursing throughout the procedure under my direct supervision. COMPLICATIONS: None immediate. PROCEDURE: Informed written consent was obtained from the patient after a thorough discussion of the procedural risks, benefits and alternatives. All questions were addressed. Maximal Sterile Barrier Technique was utilized including caps, mask, sterile gowns, sterile gloves, sterile drape, hand  hygiene and skin antiseptic. A timeout was performed prior to the initiation of the procedure. CHEST TUBE #1 A planning axial CT scan was performed. The largest fluid collection in the more inferior aspect of the lung was targeted. A suitable skin entry site was selected and marked. The skin was sterilely prepped and draped in the standard fashion using chlorhexidine skin prep. Local anesthesia was attained by infiltration with 1% lidocaine. A small dermatotomy was made. A 22 gauge spinal needle was advanced and imaging obtained to confirm an appropriate trajectory between the ribs and into the fluid collection. Once this was confirmed, a 46 [place] all-purpose drainage catheter modified with additional sideholes was advanced over the rib and into the pleural space using trocar technique. The catheter was connected to low wall suction via a Sah[place] device. Aspiration yields at least 800 mL of opaque foul-smelling purulent fluid. A sample was sent aside for Gram stain and culture. The catheter was secured to the skin with 0 silk suture and sterile bandages were applied. Repeat CT imaging demonstrates no decrease in the other 2 loculated fluid collections consistent with the suspicion the day are indeed completely separate. Therefore, plans were made to proceed with additional chest tube placement. CHEST TUBE #2 The posterior apical fluid collection was targeted next. A suitable skin entry site was selected and marked. The skin was sterilely  prepped and draped in the standard fashion using chlorhexidine skin prep. Local anesthesia was attained by infiltration with 1% lidocaine. A small dermatotomy was made. A 22 gauge spinal needle was advanced and imaging obtained to confirm an appropriate trajectory between the ribs and into the fluid collection. Once this was confirmed, a 10 [place] all-purpose drainage catheter advanced over the rib and into the pleural space using trocar technique. The catheter was connected to low  wall suction via a Sahara device. Aspiration yields 80 mL of opaque foul-smelling purulent bloody fluid. The catheter was secured to the skin with 0 silk suture and sterile bandages were applied. CHEST TUBE #3 The posterior apical fluid collection was targeted next. A suitable skin entry site was selected and marked. The skin was sterilely prepped and draped in the standard fashion using chlorhexidine skin prep. Local anesthesia was attained by infiltration with 1% lidocaine. A small dermatotomy was made. A 22 gauge spinal needle was advanced and imaging obtained to confirm an appropriate trajectory between the ribs and into the fluid collection. Once this was confirmed, a 10 [place] all-purpose drainage catheter advanced over the rib and into the pleural space using trocar technique. Approximately 800 mL of foul-smelling purulent fluid was then aspirated using a 60 cc syringe and an abscess evacuation bag. The drainage catheter was then connected to waterseal. The catheter was secured to the skin with 0 silk suture and sterile bandages were applied. IMPRESSION: 1. Placement of a total of 3 percutaneous pigtail chest tubes into the left pleural space (Tube #1, a 12 [place] via the left lower lateral chest into the most inferior and largest collection; tube #2, 10 [place] in the posterior apex, Tube #3, 32F in the left upper anterior collection). 2. Overall, at least 1,800 mL of foul-smelling purulent fluid was aspirated between the 3 drainage catheters. A sample was sent for Gram stain and culture. Electronically Signed   By: Jacqulynn Cadet M.D.   On: 09/22/2022 17:59   CT [facility] PLEURAL DRAIN W/INDWELL CATH W/IMG GUIDE  Result Date: 09/22/2022 INDICATION: 87 year old male with pneumonia and complex multiloculated left pleural effusion concerning for empyema. EXAM: CT-GUIDED CHEST TUBE PLACEMENT CT-GUIDED CHEST TUBE PLACEMENT CT-GUIDED CHEST TUBE PLACEMENT TECHNIQUE: Multidetector CT imaging of the CHEST was  performed following the standard protocol WITHOUT IV contrast. RADIATION DOSE REDUCTION: This exam was performed according to the departmental dose-optimization program which includes automated exposure control, adjustment of the mA and/or kV according to patient size and/or use of iterative reconstruction technique. MEDICATIONS: The patient is currently admitted to the hospital and receiving intravenous antibiotics. The antibiotics were administered within an appropriate time frame prior to the initiation of the procedure. ANESTHESIA/SEDATION: Moderate (conscious) sedation was employed during this procedure. A total of Versed 1 mg and Fentanyl 50 mcg was administered intravenously by the radiology nurse. Total intra-service moderate Sedation Time: 58 minutes. The patient's level of consciousness and vital signs were monitored continuously by radiology nursing throughout the procedure under my direct supervision. COMPLICATIONS: None immediate. PROCEDURE: Informed written consent was obtained from the patient after a thorough discussion of the procedural risks, benefits and alternatives. All questions were addressed. Maximal Sterile Barrier Technique was utilized including caps, mask, sterile gowns, sterile gloves, sterile drape, hand hygiene and skin antiseptic. A timeout was performed prior to the initiation of the procedure. CHEST TUBE #1 A planning axial CT scan was performed. The largest fluid collection in the more inferior aspect of the lung was targeted. A suitable skin entry  site was selected and marked. The skin was sterilely prepped and draped in the standard fashion using chlorhexidine skin prep. Local anesthesia was attained by infiltration with 1% lidocaine. A small dermatotomy was made. A 22 gauge spinal needle was advanced and imaging obtained to confirm an appropriate trajectory between the ribs and into the fluid collection. Once this was confirmed, a 65 [place] all-purpose drainage catheter modified  with additional sideholes was advanced over the rib and into the pleural space using trocar technique. The catheter was connected to low wall suction via a Sahara device. Aspiration yields at least 800 mL of opaque foul-smelling purulent fluid. A sample was sent aside for Gram stain and culture. The catheter was secured to the skin with 0 silk suture and sterile bandages were applied. Repeat CT imaging demonstrates no decrease in the other 2 loculated fluid collections consistent with the suspicion the day are indeed completely separate. Therefore, plans were made to proceed with additional chest tube placement. CHEST TUBE #2 The posterior apical fluid collection was targeted next. A suitable skin entry site was selected and marked. The skin was sterilely prepped and draped in the standard fashion using chlorhexidine skin prep. Local anesthesia was attained by infiltration with 1% lidocaine. A small dermatotomy was made. A 22 gauge spinal needle was advanced and imaging obtained to confirm an appropriate trajectory between the ribs and into the fluid collection. Once this was confirmed, a 10 [place] all-purpose drainage catheter advanced over the rib and into the pleural space using trocar technique. The catheter was connected to low wall suction via a Sahara device. Aspiration yields 80 mL of opaque foul-smelling purulent bloody fluid. The catheter was secured to the skin with 0 silk suture and sterile bandages were applied. CHEST TUBE #3 The posterior apical fluid collection was targeted next. A suitable skin entry site was selected and marked. The skin was sterilely prepped and draped in the standard fashion using chlorhexidine skin prep. Local anesthesia was attained by infiltration with 1% lidocaine. A small dermatotomy was made. A 22 gauge spinal needle was advanced and imaging obtained to confirm an appropriate trajectory between the ribs and into the fluid collection. Once this was confirmed, a 10 [place]  all-purpose drainage catheter advanced over the rib and into the pleural space using trocar technique. Approximately 800 mL of foul-smelling purulent fluid was then aspirated using a 60 cc syringe and an abscess evacuation bag. The drainage catheter was then connected to waterseal. The catheter was secured to the skin with 0 silk suture and sterile bandages were applied. IMPRESSION: 1. Placement of a total of 3 percutaneous pigtail chest tubes into the left pleural space (Tube #1, a 12 [place] via the left lower lateral chest into the most inferior and largest collection; tube #2, 10 [place] in the posterior apex, Tube #3, 2F in the left upper anterior collection). 2. Overall, at least 1,800 mL of foul-smelling purulent fluid was aspirated between the 3 drainage catheters. A sample was sent for Gram stain and culture. Electronically Signed   By: Jacqulynn Cadet M.D.   On: 09/22/2022 17:59   CT [facility] PLEURAL DRAIN W/INDWELL CATH W/IMG GUIDE  Result Date: 09/22/2022 INDICATION: 87 year old male with pneumonia and complex multiloculated left pleural effusion concerning for empyema. EXAM: CT-GUIDED CHEST TUBE PLACEMENT CT-GUIDED CHEST TUBE PLACEMENT CT-GUIDED CHEST TUBE PLACEMENT TECHNIQUE: Multidetector CT imaging of the CHEST was performed following the standard protocol WITHOUT IV contrast. RADIATION DOSE REDUCTION: This exam was performed according to the departmental dose-optimization  program which includes automated exposure control, adjustment of the mA and/or kV according to patient size and/or use of iterative reconstruction technique. MEDICATIONS: The patient is currently admitted to the hospital and receiving intravenous antibiotics. The antibiotics were administered within an appropriate time frame prior to the initiation of the procedure. ANESTHESIA/SEDATION: Moderate (conscious) sedation was employed during this procedure. A total of Versed 1 mg and Fentanyl 50 mcg was administered intravenously by the  radiology nurse. Total intra-service moderate Sedation Time: 58 minutes. The patient's level of consciousness and vital signs were monitored continuously by radiology nursing throughout the procedure under my direct supervision. COMPLICATIONS: None immediate. PROCEDURE: Informed written consent was obtained from the patient after a thorough discussion of the procedural risks, benefits and alternatives. All questions were addressed. Maximal Sterile Barrier Technique was utilized including caps, mask, sterile gowns, sterile gloves, sterile drape, hand hygiene and skin antiseptic. A timeout was performed prior to the initiation of the procedure. CHEST TUBE #1 A planning axial CT scan was performed. The largest fluid collection in the more inferior aspect of the lung was targeted. A suitable skin entry site was selected and marked. The skin was sterilely prepped and draped in the standard fashion using chlorhexidine skin prep. Local anesthesia was attained by infiltration with 1% lidocaine. A small dermatotomy was made. A 22 gauge spinal needle was advanced and imaging obtained to confirm an appropriate trajectory between the ribs and into the fluid collection. Once this was confirmed, a 23 [place] all-purpose drainage catheter modified with additional sideholes was advanced over the rib and into the pleural space using trocar technique. The catheter was connected to low wall suction via a Sah[place] device. Aspiration yields at least 800 mL of opaque foul-smelling purulent fluid. A sample was sent aside for Gram stain and culture. The catheter was secured to the skin with 0 silk suture and sterile bandages were applied. Repeat CT imaging demonstrates no decrease in the other 2 loculated fluid collections consistent with the suspicion the day are indeed completely separate. Therefore, plans were made to proceed with additional chest tube placement. CHEST TUBE #2 The posterior apical fluid collection was targeted next. A  suitable skin entry site was selected and marked. The skin was sterilely prepped and draped in the standard fashion using chlorhexidine skin prep. Local anesthesia was attained by infiltration with 1% lidocaine. A small dermatotomy was made. A 22 gauge spinal needle was advanced and imaging obtained to confirm an appropriate trajectory between the ribs and into the fluid collection. Once this was confirmed, a 10 [place] all-purpose drainage catheter advanced over the rib and into the pleural space using trocar technique. The catheter was connected to low wall suction via a [place] device. Aspiration yields 80 mL of opaque foul-smelling purulent bloody fluid. The catheter was secured to the skin with 0 silk suture and sterile bandages were applied. CHEST TUBE #3 The posterior apical fluid collection was targeted next. A suitable skin entry site was selected and marked. The skin was sterilely prepped and draped in the standard fashion using chlorhexidine skin prep. Local anesthesia was attained by infiltration with 1% lidocaine. A small dermatotomy was made. A 22 gauge spinal needle was advanced and imaging obtained to confirm an appropriate trajectory between the ribs and into the fluid collection. Once this was confirmed, a 10 [place] all-purpose drainage catheter advanced over the rib and into the pleural space using trocar technique. Approximately 800 mL of foul-smelling purulent fluid was then aspirated using a 60 cc  syringe and an abscess evacuation bag. The drainage catheter was then connected to waterseal. The catheter was secured to the skin with 0 silk suture and sterile bandages were applied. IMPRESSION: 1. Placement of a total of 3 percutaneous pigtail chest tubes into the left pleural space (Tube #1, a 12 [place] via the left lower lateral chest into the most inferior and largest collection; tube #2, 10 [place] in the posterior apex, Tube #3, 71F in the left upper anterior collection). 2. Overall, at least  1,800 mL of foul-smelling purulent fluid was aspirated between the 3 drainage catheters. A sample was sent for Gram stain and culture. Electronically Signed   By: Jacqulynn Cadet M.D.   On: 09/22/2022 17:59   CT CHEST WO CONTRAST  Result Date: 09/26/2022 CLINICAL DATA:  Pneumonia complication suspected. X-ray done showing pleural effusion. Malignancy suspected. Congestion and cough EXAM: CT CHEST WITHOUT CONTRAST TECHNIQUE: Multidetector CT imaging of the chest was performed following the standard protocol without IV contrast. RADIATION DOSE REDUCTION: This exam was performed according to the departmental dose-optimization program which includes automated exposure control, adjustment of the mA and/or kV according to patient size and/or use of iterative reconstruction technique. COMPARISON:  Radiographs earlier today FINDINGS: Cardiovascular: Normal heart size. Small pericardial effusion. Aortic valve and mitral annular calcification. Coronary artery and aortic atherosclerotic calcification. Mediastinum/Nodes: 1.9 cm left thyroid nodule. Unremarkable esophagus. No definite thoracic adenopathy on noncontrast exam. Lungs/Pleura: Layering secretions in the trachea extending into the left mainstem bronchus where there is near occlusion. Mucous plugging throughout the left lingular and lower lobe bronchi. Mild diffuse bronchial wall thickening. Large loculated pleural effusion on the left. There is suggestion of wall thickening about the loculated effusions compatible with empyemas though this is incompletely evaluated without IV contrast. Near-complete collapse of both the left upper and lower lobes. Right posterior lower atelectasis/scarring and pleural thickening. Emphysema. Upper Abdomen: Poorly evaluated.  No definite acute abnormality. Musculoskeletal: No acute osseous abnormality. IMPRESSION: Large loculated left pleural effusion. This is suspicious for empyemas though is incompletely evaluated without IV  contrast. Malignant effusion is not excluded. Large amount of secretions within the trachea and left lung bronchi with postobstructive atelectasis/pneumonia. Coronary artery atherosclerosis. Aortic Atherosclerosis (ICD10-I70.0) and Emphysema (ICD10-J43.9). 1.9 cm left thyroid nodule. Nonemergent ultrasound could be performed if clinically indicated given comorbidities and life expectancy. Electronically Signed   By: Placido Sou M.D.   On: 10/07/2022 20:36   DG Chest Port 1 View  Result Date: 10/05/2022 CLINICAL DATA:  New onset AFib EXAM: PORTABLE CHEST 1 VIEW COMPARISON:  None. FINDINGS: Cardiac silhouette appears enlarged. There is complete opacification of the left hemithorax. Right lung is clear. No pneumothorax. No acute fractures. IMPRESSION: Complete opacification of the left hemithorax, likely combination of pleural effusion and atelectasis/consolidation. Electronically Signed   By: Ronney Asters M.D.   On: 10/13/2022 18:36    Labs:  CBC: Recent Labs    09/22/22 0430 09/22/22 2137 09/23/22 0258 09/24/22 0309  WBC 26.7* 28.7* 20.8* 14.1*  HGB 10.9* 10.6* 10.8* 10.1*  HCT 35.1* 36.7* 35.6* 33.9*  PLT 412* 430* 409* 336    COAGS: Recent Labs    09/22/22 0900  INR 1.4*    BMP: Recent Labs    09/22/22 0430 09/22/22 2137 09/23/22 0258 09/24/22 0309  NA 136 140 137 137  K 4.9 5.0 4.6 4.3  CL 104 109 109 110  CO2 24 23 21* 21*  GLUCOSE 130* 104* 121* 97  BUN 49* 47* 48* 40*  CALCIUM 8.9 8.5* 8.2* 8.0*  CREATININE 1.11 1.05 0.95 0.91  GFRNONAA >60 >60 >60 >60    LIVER FUNCTION TESTS: Recent Labs    10/14/2022 1752 09/22/22 0430 09/22/22 2137 09/24/22 0309  BILITOT 0.6 0.5 0.6 0.4  AST 23 18 19 22   ALT 33 25 23 23   ALKPHOS 71 61 61 49  PROT 6.1* 5.2* 4.9* 4.4*  ALBUMIN 2.0* 1.8* 1.7* <1.5*    Assessment and Plan: Pt with hx pneumonia/ left chest empyema/left mainstem endobronchial occlusion; on 09/22/22 underwent: 1. Placement of a total of 3 percutaneous  pigtail chest tubes into the left pleural space (Tube #1, a 12 via the left lower lateral chest into the most inferior and largest collection; tube #2, 10 11/21/22 in the posterior apex, Tube #3, 45F in the left upper anterior collection).  Afebrile, WBC 14.1(20.8), hgb stable, creat nl, drain fl cx/cytology pend; CXR today- 1. Increased opacity in the left lower chest which could be due to increasing consolidation or reaccumulating pleural fluid. 2. Increased haziness along side the chest tube in the lateral left upper to mid lung field which could also be due to airspace disease or reaccumulating fluid. 3. Increased small to moderate right pleural effusion and increased patchy atelectasis or consolidation in the right lower lung field.  Pt received TPA/dornase via chest tubes today by CCM; cont current tx, check final cx/cyt, antbx, monitor output closely ; obtain f/u CT once output minimal   Electronically Signed: D. [place], PA-C 09/24/2022, 4:16 PM   I spent a total of 15 Minutes at the the patient's bedside AND on the patient's hospital floor or unit, greater than 50% of which was counseling/coordinating care for left chest empyema drains    Patient ID: Ryan Reynolds, male   DOB: 1936/02/26, 87 y.o.   MRN: [id number]

## 2022-09-24 NOTE — Progress Notes (Signed)
PROGRESS NOTE    KALYAN BARABAS  CHE:[id number] DOB: 14-Nov-1935 DOA: 10-06-2022 PCP: Patient, No Pcp Per    Brief Narrative:  87 year old gentleman with history of previous smoking presented with more than 2 weeks of cough and fatigue, 50 pound weight loss for last 1 year.  Progressive weakness.  Patient does have a history of more than 60 pack/year of smoking.  He just quit 2 months ago.  In the emergency room blood pressures initially low responded to IV fluids.  Heart rate 114.  Temperature 100.9.  93% on room air.  WC count 25.5.  Magnesium was 1.3.  Chest x-ray showed complete opacification of the left hemithorax, CT scan of the chest showed large loculated left pleural effusion.  Case was discussed with pulmonary, recommended IR for diagnostic and therapeutic chest tube placement. 1/8, IR placed 3 pigtail catheters and drained more than 2 L of pus. Patient went into rapid a flutter but not symptomatic. Overnight blood pressures low, started on peripheral Levophed.   1/10, Levophed off.  Rate controlled on amiodarone.  Chest tube actively draining.   Assessment & Plan:   Severe sepsis present on admission /loculated empyema:   Clinically stabilizing.  Transiently on peripheral Levophed.  Discontinued.  Received multiple doses of fluid boluses and now continue on maintenance fluid. Blood cultures negative so far.  Fluid cultures negative.  Rocephin azithromycin and Flagyl to continue today. Chest physiotherapy, incentive spirometry, deep breathing exercises, sputum induction, mucolytic's and bronchodilators.  Mobilize out of bed today.  Consult PT OT. Supplemental oxygen to keep saturations more than 90%. Repeat chest x-ray with reaccumulation today.  IR and PCCM following. Pulmonary following.  May need repeat CT scan once chest tubes are out to evaluate for any underlying malignancy. Cytology pending.  New onset a flutter: Rate controlled with amiodarone drip.  Seen by cardiology.   As recommended by cardiology, will start patient on amiodarone 400 mg twice daily for 10 days followed by amiodarone 200 mg daily.   Echocardiogram with normal ejection fraction.   TSH normal.   Currently not pursuing anticoagulation.    Hypomagnesemia: Replace further.  Monitor levels.  Thyroid nodule: Will need outpatient follow-up.  DVT prophylaxis: SCDs Start: 06-Oct-2022 2316   Code Status: DNR Family Communication: wife on the phone, called and updated 1/9. Disposition Plan: Status is: Inpatient Remains inpatient appropriate because: Inpatient procedures, IV antibiotics     Consultants:  Interventional radiology Cardiology PCCM  Procedures:  None  Antimicrobials:  Rocephin and azithromycin 2022/10/06--- Flagyl 1/9---   Subjective:  Patient seen and examined.  He was sleepy.  Looks tired today.  He himself denies any complaints. Patient tells me he does not know how he is doing till now in the morning.  Pain is controlled.  Blood pressures are adequate.  Still in a flutter but heart rate is mostly controlled.   Objective: Vitals:   09/24/22 0630 09/24/22 0700 09/24/22 0800 09/24/22 0900  BP: (!) 119/48 (!) 98/51 (!) 107/54 (!) 110/56  Pulse: 63 66 65 65  Resp: 19 18 (!) 21 20  Temp:   97.6 F (36.4 C)   TempSrc:   Oral   SpO2: 92% 94% 97% 94%  Weight:      Height:        Intake/Output Summary (Last 24 hours) at 09/24/2022 1026 Last data filed at 09/24/2022 0900 Gross per 24 hour  Intake 3314.17 ml  Output 921 ml  Net 2393.17 ml   [employer]  09/27/2022 1733  Weight: 82 kg    Examination:  General exam: Appears calm , slightly anxious today. Thin and frail.  Debilitated. Respiratory system: Poor air entry at left base. No added sounds.  Without any distress.   All 3 chest tubes are draining.  1 chest tube with chylous fluid.  Recorded output 450 mL last 24 hours. SpO2: 94 % O2 Flow Rate (L/min): 5 L/min FiO2 (%): 100 %  Cardiovascular system: S1 &  S2 heard, irregularly irregular.  Rate controlled. Gastrointestinal system: Abdomen is nondistended, soft and nontender. No organomegaly or masses felt. Normal bowel sounds heard. Central nervous system: Alert and oriented. No focal neurological deficits. Extremities: Symmetric 5 x 5 power. Skin: No rashes, lesions or ulcers Psychiatry: Judgement and insight appear normal.  Mildly anxious today.    Data Reviewed: I have personally reviewed following labs and imaging studies  CBC: Recent Labs  Lab 09/16/2022 1752 09/22/22 0430 09/22/22 2137 09/23/22 0258 09/24/22 0309  WBC 25.5* 26.7* 28.7* 20.8* 14.1*  NEUTROABS  --   --   --  17.8* 12.0*  HGB 11.9* 10.9* 10.6* 10.8* 10.1*  HCT 37.5* 35.1* 36.7* 35.6* 33.9*  MCV 90.8 92.1 99.7 96.7 95.2  PLT 510* 412* 430* 409* 336   Basic Metabolic Panel: Recent Labs  Lab 09/26/2022 1752 09/22/22 0430 09/22/22 2137 09/23/22 0258 09/24/22 0309  NA 134* 136 140 137 137  K 5.1 4.9 5.0 4.6 4.3  CL 101 104 109 109 110  CO2 24 24 23  21* 21*  GLUCOSE 188* 130* 104* 121* 97  BUN 50* 49* 47* 48* 40*  CREATININE 1.38* 1.11 1.05 0.95 0.91  CALCIUM 9.6 8.9 8.5* 8.2* 8.0*  MG 1.3* 1.7  --  2.1 1.6*  PHOS  --   --   --  3.8  --    GFR: Estimated Creatinine Clearance: 66.3 mL/min (by C-G formula based on SCr of 0.91 mg/dL). Liver Function Tests: Recent Labs  Lab 09/25/2022 1752 09/22/22 0430 09/22/22 2137 09/24/22 0309  AST 23 18 19 22   ALT 33 25 23 23   ALKPHOS 71 61 61 49  BILITOT 0.6 0.5 0.6 0.4  PROT 6.1* 5.2* 4.9* 4.4*  ALBUMIN 2.0* 1.8* 1.7* <1.5*   No results for input(s): "LIPASE", "AMYLASE" in the last 168 hours. No results for input(s): "AMMONIA" in the last 168 hours. Coagulation Profile: Recent Labs  Lab 09/22/22 0900  INR 1.4*   Cardiac Enzymes: No results for input(s): "CKTOTAL", "CKMB", "CKMBINDEX", "TROPONINI" in the last 168 hours. BNP (last 3 results) No results for input(s): "PROBNP" in the last 8760  hours. HbA1C: No results for input(s): "HGBA1C" in the last 72 hours. CBG: No results for input(s): "GLUCAP" in the last 168 hours. Lipid Profile: No results for input(s): "CHOL", "HDL", "LDLCALC", "TRIG", "CHOLHDL", "LDLDIRECT" in the last 72 hours. Thyroid Function Tests: Recent Labs    09/23/22 0258  TSH 0.673   Anemia Panel: No results for input(s): "VITAMINB12", "FOLATE", "FERRITIN", "TIBC", "IRON", "RETICCTPCT" in the last 72 hours. Sepsis Labs: Recent Labs  Lab 09/17/2022 2000 09/22/22 0430  PROCALCITON  --  0.61  LATICACIDVEN 1.9  --     Recent Results (from the past 240 hour(s))  Expectorated Sputum Assessment w Gram Stain, Rflx to Resp Cult     Status: None   Collection Time: 09/23/2022 11:42 AM   Specimen: Sputum  Result Value Ref Range Status   Specimen Description SPUTUM  Final   Special Requests NONE  Final  Sputum evaluation   Final    THIS SPECIMEN IS ACCEPTABLE FOR SPUTUM CULTURE Performed at [facility] [address], [place], [place] [address]    Report Status 09/22/2022 FINAL  Final  Culture, blood (single)     Status: None (Preliminary result)   Collection Time: 2022/09/30  5:54 PM   Specimen: BLOOD  Result Value Ref Range Status   Specimen Description   Final    BLOOD BLOOD LEFT ARM Performed at [facility] [address], [place], [place] [address]    Special Requests   Final    BOTTLES DRAWN AEROBIC AND ANAEROBIC Blood Culture results may not be optimal due to an inadequate volume of blood received in culture bottles Performed at [facility] [address] [place], [place] [address]    Culture   Final    NO GROWTH 3 DAYS Performed at [facility], [place] [address], [place], [place] [address]    Report Status PENDING  Incomplete  Culture, Respiratory w Gram Stain     Status: None (Preliminary result)   Collection Time: 09/22/22 11:45 AM   Specimen: SPU  Result Value Ref Range  Status   Specimen Description   Final    SPUTUM Performed at [facility] [address] [place], [place] [address]    Special Requests   Final    NONE Reflexed from [id number] Performed at [facility], [place] [address], [place], [place] [address]    Gram Stain   Final    NO WBC SEEN FEW GRAM POSITIVE COCCI IN CHAINS FEW GRAM POSITIVE RODS    Culture   Final    CULTURE REINCUBATED FOR BETTER GROWTH Performed at [facility], [place] [address] [place], [place] [address]    Report Status PENDING  Incomplete  MRSA Next Gen by PCR, Nasal     Status: None   Collection Time: 09/22/22  2:42 PM   Specimen: Nasal Mucosa; Nasal Swab  Result Value Ref Range Status   MRSA by PCR Next Gen NOT DETECTED NOT DETECTED Final    Comment: (NOTE) The GeneXpert MRSA Assay (FDA approved for NASAL specimens only), is one component of a comprehensive MRSA colonization surveillance program. It is not intended to diagnose MRSA infection nor to guide or monitor treatment for MRSA infections. Test performance is not FDA approved in patients less than 56 years old. Performed at [facility], [place] [address] [place], [place] [address]   Body fluid culture w Gram Stain     Status: None (Preliminary result)   Collection Time: 09/22/22  5:05 PM   Specimen: Pleural Fluid  Result Value Ref Range Status   Specimen Description   Final    PLEURAL Performed at [facility] [address], [place], [place] [address]    Special Requests   Final    NONE Performed at [facility], [place] [address], [place], [place] [address]    Gram Stain   Final    ABUNDANT WBC PRESENT, PREDOMINANTLY PMN ABUNDANT GRAM POSITIVE COCCI IN PAIRS IN CHAINS Gram Stain Report Called to,Read Back By and Verified With: RN Reinaldo Berber [phone] @[phone]  FH    Culture   Final    CULTURE REINCUBATED FOR BETTER GROWTH Performed at [facility], [place] [address], [place],  [address]    Report Status PENDING  Incomplete  Fungus Culture With Stain  Status: None (Preliminary result)   Collection Time: 09/22/22  5:05 PM   Specimen: Pleural Fluid  Result Value Ref Range Status   Fungus Stain Final report  Final    Comment: (NOTE) Performed At: [facility] [address] [place], [place] [id number] Jolene Schimke MD IO:[id number]    Fungus (Mycology) Culture PENDING  Incomplete   Fungal Source PLEURAL  Final    Comment: Performed at [facility], [address] [place], [place] [address]  Fungus Culture Result     Status: None   Collection Time: 09/22/22  5:05 PM  Result Value Ref Range Status   Result 1 Comment  Final    Comment: (NOTE) KOH/Calcofluor preparation:  no fungus observed. Performed At: [facility] [address] [place], [place] [id number] Jolene Schimke MD FY:[id number]   Resp panel by RT-PCR (RSV, Flu A&B, Covid) Anterior Nasal Swab     Status: None   Collection Time: 09/22/22 11:10 PM   Specimen: Anterior Nasal Swab  Result Value Ref Range Status   SARS Coronavirus 2 by RT PCR NEGATIVE NEGATIVE Final    Comment: (NOTE) SARS-CoV-2 target nucleic acids are NOT DETECTED.  The SARS-CoV-2 RNA is generally detectable in upper respiratory specimens during the acute phase of infection. The lowest concentration of SARS-CoV-2 viral copies this assay can detect is 138 copies/mL. A negative result does not preclude SARS-Cov-2 infection and should not be used as the sole basis for treatment or other patient management decisions. A negative result may occur with  improper specimen collection/handling, submission of specimen other than nasopharyngeal swab, presence of viral mutation(s) within the areas targeted by this assay, and inadequate number of viral copies(<138 copies/mL). A negative result must be combined with clinical observations, patient history, and  epidemiological information. The expected result is Negative.  Fact Sheet for Patients:  BloggerCourse.com  Fact Sheet for Healthcare Providers:  SeriousBroker.it  This test is no t yet approved or cleared by the [place] FDA and  has been authorized for detection and/or diagnosis of SARS-CoV-2 by FDA under an Emergency Use Authorization (EUA). This EUA will remain  in effect (meaning this test can be used) for the duration of the COVID-19 declaration under Section 564(b)(1) of the Act, 21 U.S.C.section 360bbb-3(b)(1), unless the authorization is terminated  or revoked sooner.       Influenza A by PCR NEGATIVE NEGATIVE Final   Influenza B by PCR NEGATIVE NEGATIVE Final    Comment: (NOTE) The Xpert Xpress SARS-CoV-2/FLU/RSV plus assay is intended as an aid in the diagnosis of influenza from Nasopharyngeal swab specimens and should not be used as a sole basis for treatment. Nasal washings and aspirates are unacceptable for Xpert Xpress SARS-CoV-2/FLU/RSV testing.  Fact Sheet for Patients: BloggerCourse.com  Fact Sheet for Healthcare Providers: SeriousBroker.it  This test is not yet approved or cleared by the [place] FDA and has been authorized for detection and/or diagnosis of SARS-CoV-2 by FDA under an Emergency Use Authorization (EUA). This EUA will remain in effect (meaning this test can be used) for the duration of the COVID-19 declaration under Section 564(b)(1) of the Act, 21 U.S.C. section 360bbb-3(b)(1), unless the authorization is terminated or revoked.     Resp Syncytial Virus by PCR NEGATIVE NEGATIVE Final    Comment: (NOTE) Fact Sheet for Patients: BloggerCourse.com  Fact Sheet for Healthcare Providers: SeriousBroker.it  This test is not yet approved or cleared by the [place] FDA and has been  authorized for detection and/or  diagnosis of SARS-CoV-2 by FDA under an Emergency Use Authorization (EUA). This EUA will remain in effect (meaning this test can be used) for the duration of the COVID-19 declaration under Section 564(b)(1) of the Act, 21 U.S.C. section 360bbb-3(b)(1), unless the authorization is terminated or revoked.  Performed at [facility], [address], [place], [place] [address]          Radiology Studies: DG CHEST PORT 1 VIEW  Result Date: 09/24/2022 CLINICAL DATA:  [id number] with empyema, chest tubes. EXAM: PORTABLE CHEST 1 VIEW COMPARISON:  Portable chest yesterday at 7:54 a.m., chest CT 10/08/2022 without contrast. FINDINGS: 4:12 a.m. There are 3 left pigtail chest tube stable in positioning. No visible pneumothorax. There is increased opacity in the left lower chest which could be due to increasing consolidation or reaccumulating pleural fluid. There is increasing haziness along side the chest tube in the lateral left upper to mid lung field which could also be due to airspace disease or accumulating fluid. On the right, there is increased small to moderate pleural effusion and increased patchy atelectasis or consolidation in the right lower lung field. The cardiac size is stable. There is mild central vascular fullness without overt edema. Right upper lung field remains clear. Mild S shaped thoracic scoliosis. IMPRESSION: 1. Increased opacity in the left lower chest which could be due to increasing consolidation or reaccumulating pleural fluid. 2. Increased haziness along side the chest tube in the lateral left upper to mid lung field which could also be due to airspace disease or reaccumulating fluid. 3. Increased small to moderate right pleural effusion and increased patchy atelectasis or consolidation in the right lower lung field. Electronically Signed   By: Almira Bar M.D.   On: 09/24/2022 06:38   ECHOCARDIOGRAM COMPLETE  Result Date:  09/23/2022    ECHOCARDIOGRAM REPORT   Patient Name:   JOSIP MEROLLA Date of Exam: 09/23/2022 Medical Rec #:  [id number]        Height:       72.0 in Accession #:    [id number]       Weight:       180.8 lb Date of Birth:  May 27, 1936       BSA:          2.041 m Patient Age:    86 years         BP:           98/57 mmHg Patient Gender: M                HR:           64 bpm. Exam Location:  Inpatient Procedure: 2D Echo, Cardiac Doppler and Color Doppler Indications:    R94.31 Abnormal EKG  History:        Patient has no prior history of Echocardiogram examinations.                 Arrythmias:Atrial Fibrillation.  Sonographer:    Eulah Pont RDCS Referring Phys: [id number] [facility]  Sonographer Comments: Image acquisition challenging due to patient body habitus. IMPRESSIONS  1. Left ventricular ejection fraction, by estimation, is 60 to 65%. The left ventricle has normal function. The left ventricle has no regional wall motion abnormalities. Left ventricular diastolic function could not be evaluated.  2. Right ventricular systolic function is normal. The right ventricular size is mildly enlarged. There is mildly elevated pulmonary artery systolic pressure. The estimated right ventricular systolic pressure is 41.6 mmHg.  3. Right atrial size was mildly dilated.  4. The mitral valve is degenerative. Mild mitral valve regurgitation. No evidence of mitral stenosis. Moderate mitral annular calcification.  5. The aortic valve is tricuspid. There is moderate calcification of the aortic valve. There is moderate thickening of the aortic valve. Aortic valve regurgitation is trivial. Mild aortic valve stenosis. Aortic valve area, by VTI measures 2.51 cm. Aortic valve mean gradient measures 10.0 mmHg. Aortic valve Vmax measures 2.23 m/s.  6. There is mild dilatation of the ascending aorta, measuring 41 mm.  7. The inferior vena cava is dilated in size with <50% respiratory variability, suggesting right atrial pressure of 15  mmHg. FINDINGS  Left Ventricle: Left ventricular ejection fraction, by estimation, is 60 to 65%. The left ventricle has normal function. The left ventricle has no regional wall motion abnormalities. The left ventricular internal cavity size was normal in size. There is  no left ventricular hypertrophy. Left ventricular diastolic function could not be evaluated due to atrial fibrillation. Left ventricular diastolic function could not be evaluated. Right Ventricle: The right ventricular size is mildly enlarged. No increase in right ventricular wall thickness. Right ventricular systolic function is normal. There is mildly elevated pulmonary artery systolic pressure. The tricuspid regurgitant velocity is 2.58 m/s, and with an assumed right atrial pressure of 15 mmHg, the estimated right ventricular systolic pressure is 41.6 mmHg. Left Atrium: Left atrial size was normal in size. Right Atrium: Right atrial size was mildly dilated. Pericardium: Trivial pericardial effusion is present. Mitral Valve: The mitral valve is degenerative in appearance. Moderate mitral annular calcification. Mild mitral valve regurgitation. No evidence of mitral valve stenosis. Tricuspid Valve: The tricuspid valve is grossly normal. Tricuspid valve regurgitation is mild . No evidence of tricuspid stenosis. Aortic Valve: The aortic valve is tricuspid. There is moderate calcification of the aortic valve. There is moderate thickening of the aortic valve. Aortic valve regurgitation is trivial. Mild aortic stenosis is present. Aortic valve mean gradient measures 10.0 mmHg. Aortic valve peak gradient measures 19.9 mmHg. Aortic valve area, by VTI measures 2.51 cm. Pulmonic Valve: The pulmonic valve was grossly normal. Pulmonic valve regurgitation is trivial. No evidence of pulmonic stenosis. Aorta: The aortic root is normal in size and structure. There is mild dilatation of the ascending aorta, measuring 41 mm. Venous: The inferior vena cava is dilated  in size with less than 50% respiratory variability, suggesting right atrial pressure of 15 mmHg. IAS/Shunts: The atrial septum is grossly normal.  LEFT VENTRICLE PLAX 2D LVIDd:         3.50 cm   Diastology LVIDs:         1.80 cm   LV e' medial:    9.20 cm/s LV PW:         1.00 cm   LV E/e' medial:  15.4 LV IVS:        1.00 cm   LV e' lateral:   9.11 cm/s LVOT diam:     2.30 cm   LV E/e' lateral: 15.6 LV SV:         114 LV SV Index:   56 LVOT Area:     4.15 cm  RIGHT VENTRICLE RV S prime:     14.70 cm/s TAPSE (M-mode): 2.0 cm LEFT ATRIUM           Index        RIGHT ATRIUM           Index LA diam:  4.40 cm 2.16 cm/m   RA Area:     18.10 cm LA Vol (A2C): 46.5 ml 22.78 ml/m  RA Volume:   44.70 ml  21.90 ml/m LA Vol (A4C): 57.1 ml 27.98 ml/m  AORTIC VALVE AV Area (Vmax):    2.36 cm AV Area (Vmean):   2.26 cm AV Area (VTI):     2.51 cm AV Vmax:           223.00 cm/s AV Vmean:          149.000 cm/s AV VTI:            0.455 m AV Peak Grad:      19.9 mmHg AV Mean Grad:      10.0 mmHg LVOT Vmax:         126.50 cm/s LVOT Vmean:        81.050 cm/s LVOT VTI:          0.274 m LVOT/AV VTI ratio: 0.60  AORTA Ao Root diam: 3.70 cm Ao Asc diam:  4.10 cm MITRAL VALVE                TRICUSPID VALVE MV Area (PHT): 4.39 cm     TR Peak grad:   26.6 mmHg MV Decel Time: 173 msec     TR Vmax:        258.00 cm/s MR Peak grad: 30.1 mmHg MR Mean grad: 23.0 mmHg     SHUNTS MR Vmax:      274.50 cm/s   Systemic VTI:  0.27 m MR Vmean:     233.0 cm/s    Systemic Diam: 2.30 cm MV E velocity: 142.00 cm/s MV A velocity: 50.70 cm/s MV E/A ratio:  2.80 Lennie Odor MD Electronically signed by Lennie Odor MD Signature Date/Time: 09/23/2022/4:44:56 PM    Final    DG CHEST PORT 1 VIEW  Result Date: 09/23/2022 CLINICAL DATA:  Pneumonia EXAM: PORTABLE CHEST 1 VIEW COMPARISON:  Radiograph 09/22/2022 FINDINGS: There are 2 left apical chest tubes and a left basilar chest tube unchanged in position. Unchanged residual pleural effusion and  adjacent atelectasis. Unchanged small right pleural effusion. Unchanged small lucency in the left peripheral lung base consistent with pneumothorax, and with adjacent pleural thickening. Thoracic spondylosis. Bones are unchanged. IMPRESSION: Unchanged left pleural effusion with small basilar gas component and multiple chest tubes in place. Unchanged adjacent atelectasis. Unchanged small right pleural effusion and atelectasis. Electronically Signed   By: Caprice Renshaw M.D.   On: 09/23/2022 08:23   DG CHEST PORT 1 VIEW  Result Date: 09/22/2022 CLINICAL DATA:  Chest tube placement. EXAM: PORTABLE CHEST 1 VIEW COMPARISON:  10-14-22. FINDINGS: Three pigtail chest tubes have been inserted on the left, 1 projecting over the upper medial aspect of the hemithorax, another over the mid to upper lateral hemithorax and the third over the inferior, medial left hemithorax. There has been significant improvement with a notable decrease in opacity in the left mid and upper lung consistent with evacuation of loculated pleural effusions. Opacity at the left base has improved. Small left lateral lung base pneumothorax, likely ex vacuole. No apical pneumothorax. There is opacity at the right lung base that has developed since the previous day's study. This is likely a combination of a small effusion and atelectasis. Remainder of the right lung is clear. IMPRESSION: 1. Significant interval improvement in left lung aeration after placement of 3 left hemithorax chest tubes. 2. Small left inferior pneumothorax. 3. New opacity at the right lung  base suspected to be a small effusion with atelectasis. Electronically Signed   By: Lajean Manes M.D.   On: 09/22/2022 21:16   CT [facility] PLEURAL DRAIN W/INDWELL CATH W/IMG GUIDE  Result Date: 09/22/2022 INDICATION: 87 year old male with pneumonia and complex multiloculated left pleural effusion concerning for empyema. EXAM: CT-GUIDED CHEST TUBE PLACEMENT CT-GUIDED CHEST TUBE PLACEMENT CT-GUIDED  CHEST TUBE PLACEMENT TECHNIQUE: Multidetector CT imaging of the CHEST was performed following the standard protocol WITHOUT IV contrast. RADIATION DOSE REDUCTION: This exam was performed according to the departmental dose-optimization program which includes automated exposure control, adjustment of the mA and/or kV according to patient size and/or use of iterative reconstruction technique. MEDICATIONS: The patient is currently admitted to the hospital and receiving intravenous antibiotics. The antibiotics were administered within an appropriate time frame prior to the initiation of the procedure. ANESTHESIA/SEDATION: Moderate (conscious) sedation was employed during this procedure. A total of Versed 1 mg and Fentanyl 50 mcg was administered intravenously by the radiology nurse. Total intra-service moderate Sedation Time: 58 minutes. The patient's level of consciousness and vital signs were monitored continuously by radiology nursing throughout the procedure under my direct supervision. COMPLICATIONS: None immediate. PROCEDURE: Informed written consent was obtained from the patient after a thorough discussion of the procedural risks, benefits and alternatives. All questions were addressed. Maximal Sterile Barrier Technique was utilized including caps, mask, sterile gowns, sterile gloves, sterile drape, hand hygiene and skin antiseptic. A timeout was performed prior to the initiation of the procedure. CHEST TUBE #1 A planning axial CT scan was performed. The largest fluid collection in the more inferior aspect of the lung was targeted. A suitable skin entry site was selected and marked. The skin was sterilely prepped and draped in the standard fashion using chlorhexidine skin prep. Local anesthesia was attained by infiltration with 1% lidocaine. A small dermatotomy was made. A 22 gauge spinal needle was advanced and imaging obtained to confirm an appropriate trajectory between the ribs and into the fluid collection.  Once this was confirmed, a 30 [place] all-purpose drainage catheter modified with additional sideholes was advanced over the rib and into the pleural space using trocar technique. The catheter was connected to low wall suction via a Sahara device. Aspiration yields at least 800 mL of opaque foul-smelling purulent fluid. A sample was sent aside for Gram stain and culture. The catheter was secured to the skin with 0 silk suture and sterile bandages were applied. Repeat CT imaging demonstrates no decrease in the other 2 loculated fluid collections consistent with the suspicion the day are indeed completely separate. Therefore, plans were made to proceed with additional chest tube placement. CHEST TUBE #2 The posterior apical fluid collection was targeted next. A suitable skin entry site was selected and marked. The skin was sterilely prepped and draped in the standard fashion using chlorhexidine skin prep. Local anesthesia was attained by infiltration with 1% lidocaine. A small dermatotomy was made. A 22 gauge spinal needle was advanced and imaging obtained to confirm an appropriate trajectory between the ribs and into the fluid collection. Once this was confirmed, a 10 [place] all-purpose drainage catheter advanced over the rib and into the pleural space using trocar technique. The catheter was connected to low wall suction via a Sahara device. Aspiration yields 80 mL of opaque foul-smelling purulent bloody fluid. The catheter was secured to the skin with 0 silk suture and sterile bandages were applied. CHEST TUBE #3 The posterior apical fluid collection was targeted next. A suitable skin entry site  was selected and marked. The skin was sterilely prepped and draped in the standard fashion using chlorhexidine skin prep. Local anesthesia was attained by infiltration with 1% lidocaine. A small dermatotomy was made. A 22 gauge spinal needle was advanced and imaging obtained to confirm an appropriate trajectory between the  ribs and into the fluid collection. Once this was confirmed, a 10 [place] all-purpose drainage catheter advanced over the rib and into the pleural space using trocar technique. Approximately 800 mL of foul-smelling purulent fluid was then aspirated using a 60 cc syringe and an abscess evacuation bag. The drainage catheter was then connected to waterseal. The catheter was secured to the skin with 0 silk suture and sterile bandages were applied. IMPRESSION: 1. Placement of a total of 3 percutaneous pigtail chest tubes into the left pleural space (Tube #1, a 12 [place] via the left lower lateral chest into the most inferior and largest collection; tube #2, 10 [place] in the posterior apex, Tube #3, 66F in the left upper anterior collection). 2. Overall, at least 1,800 mL of foul-smelling purulent fluid was aspirated between the 3 drainage catheters. A sample was sent for Gram stain and culture. Electronically Signed   By: Malachy MoanHeath  McCullough M.D.   On: 09/22/2022 17:59   CT [facility] PLEURAL DRAIN W/INDWELL CATH W/IMG GUIDE  Result Date: 09/22/2022 INDICATION: 87 year old male with pneumonia and complex multiloculated left pleural effusion concerning for empyema. EXAM: CT-GUIDED CHEST TUBE PLACEMENT CT-GUIDED CHEST TUBE PLACEMENT CT-GUIDED CHEST TUBE PLACEMENT TECHNIQUE: Multidetector CT imaging of the CHEST was performed following the standard protocol WITHOUT IV contrast. RADIATION DOSE REDUCTION: This exam was performed according to the departmental dose-optimization program which includes automated exposure control, adjustment of the mA and/or kV according to patient size and/or use of iterative reconstruction technique. MEDICATIONS: The patient is currently admitted to the hospital and receiving intravenous antibiotics. The antibiotics were administered within an appropriate time frame prior to the initiation of the procedure. ANESTHESIA/SEDATION: Moderate (conscious) sedation was employed during this procedure. A total  of Versed 1 mg and Fentanyl 50 mcg was administered intravenously by the radiology nurse. Total intra-service moderate Sedation Time: 58 minutes. The patient's level of consciousness and vital signs were monitored continuously by radiology nursing throughout the procedure under my direct supervision. COMPLICATIONS: None immediate. PROCEDURE: Informed written consent was obtained from the patient after a thorough discussion of the procedural risks, benefits and alternatives. All questions were addressed. Maximal Sterile Barrier Technique was utilized including caps, mask, sterile gowns, sterile gloves, sterile drape, hand hygiene and skin antiseptic. A timeout was performed prior to the initiation of the procedure. CHEST TUBE #1 A planning axial CT scan was performed. The largest fluid collection in the more inferior aspect of the lung was targeted. A suitable skin entry site was selected and marked. The skin was sterilely prepped and draped in the standard fashion using chlorhexidine skin prep. Local anesthesia was attained by infiltration with 1% lidocaine. A small dermatotomy was made. A 22 gauge spinal needle was advanced and imaging obtained to confirm an appropriate trajectory between the ribs and into the fluid collection. Once this was confirmed, a 5412 [place] all-purpose drainage catheter modified with additional sideholes was advanced over the rib and into the pleural space using trocar technique. The catheter was connected to low wall suction via a Sahara device. Aspiration yields at least 800 mL of opaque foul-smelling purulent fluid. A sample was sent aside for Gram stain and culture. The catheter was secured to the skin  with 0 silk suture and sterile bandages were applied. Repeat CT imaging demonstrates no decrease in the other 2 loculated fluid collections consistent with the suspicion the day are indeed completely separate. Therefore, plans were made to proceed with additional chest tube placement.  CHEST TUBE #2 The posterior apical fluid collection was targeted next. A suitable skin entry site was selected and marked. The skin was sterilely prepped and draped in the standard fashion using chlorhexidine skin prep. Local anesthesia was attained by infiltration with 1% lidocaine. A small dermatotomy was made. A 22 gauge spinal needle was advanced and imaging obtained to confirm an appropriate trajectory between the ribs and into the fluid collection. Once this was confirmed, a 10 [place] all-purpose drainage catheter advanced over the rib and into the pleural space using trocar technique. The catheter was connected to low wall suction via a Sahara device. Aspiration yields 80 mL of opaque foul-smelling purulent bloody fluid. The catheter was secured to the skin with 0 silk suture and sterile bandages were applied. CHEST TUBE #3 The posterior apical fluid collection was targeted next. A suitable skin entry site was selected and marked. The skin was sterilely prepped and draped in the standard fashion using chlorhexidine skin prep. Local anesthesia was attained by infiltration with 1% lidocaine. A small dermatotomy was made. A 22 gauge spinal needle was advanced and imaging obtained to confirm an appropriate trajectory between the ribs and into the fluid collection. Once this was confirmed, a 10 [place] all-purpose drainage catheter advanced over the rib and into the pleural space using trocar technique. Approximately 800 mL of foul-smelling purulent fluid was then aspirated using a 60 cc syringe and an abscess evacuation bag. The drainage catheter was then connected to waterseal. The catheter was secured to the skin with 0 silk suture and sterile bandages were applied. IMPRESSION: 1. Placement of a total of 3 percutaneous pigtail chest tubes into the left pleural space (Tube #1, a 12 [place] via the left lower lateral chest into the most inferior and largest collection; tube #2, 10 [place] in the posterior apex, Tube  #3, 108F in the left upper anterior collection). 2. Overall, at least 1,800 mL of foul-smelling purulent fluid was aspirated between the 3 drainage catheters. A sample was sent for Gram stain and culture. Electronically Signed   By: Malachy Moan M.D.   On: 09/22/2022 17:59   CT [facility] PLEURAL DRAIN W/INDWELL CATH W/IMG GUIDE  Result Date: 09/22/2022 INDICATION: 87 year old male with pneumonia and complex multiloculated left pleural effusion concerning for empyema. EXAM: CT-GUIDED CHEST TUBE PLACEMENT CT-GUIDED CHEST TUBE PLACEMENT CT-GUIDED CHEST TUBE PLACEMENT TECHNIQUE: Multidetector CT imaging of the CHEST was performed following the standard protocol WITHOUT IV contrast. RADIATION DOSE REDUCTION: This exam was performed according to the departmental dose-optimization program which includes automated exposure control, adjustment of the mA and/or kV according to patient size and/or use of iterative reconstruction technique. MEDICATIONS: The patient is currently admitted to the hospital and receiving intravenous antibiotics. The antibiotics were administered within an appropriate time frame prior to the initiation of the procedure. ANESTHESIA/SEDATION: Moderate (conscious) sedation was employed during this procedure. A total of Versed 1 mg and Fentanyl 50 mcg was administered intravenously by the radiology nurse. Total intra-service moderate Sedation Time: 58 minutes. The patient's level of consciousness and vital signs were monitored continuously by radiology nursing throughout the procedure under my direct supervision. COMPLICATIONS: None immediate. PROCEDURE: Informed written consent was obtained from the patient after a thorough discussion of the procedural  risks, benefits and alternatives. All questions were addressed. Maximal Sterile Barrier Technique was utilized including caps, mask, sterile gowns, sterile gloves, sterile drape, hand hygiene and skin antiseptic. A timeout was performed prior to the  initiation of the procedure. CHEST TUBE #1 A planning axial CT scan was performed. The largest fluid collection in the more inferior aspect of the lung was targeted. A suitable skin entry site was selected and marked. The skin was sterilely prepped and draped in the standard fashion using chlorhexidine skin prep. Local anesthesia was attained by infiltration with 1% lidocaine. A small dermatotomy was made. A 22 gauge spinal needle was advanced and imaging obtained to confirm an appropriate trajectory between the ribs and into the fluid collection. Once this was confirmed, a 47 [place] all-purpose drainage catheter modified with additional sideholes was advanced over the rib and into the pleural space using trocar technique. The catheter was connected to low wall suction via a Sahara device. Aspiration yields at least 800 mL of opaque foul-smelling purulent fluid. A sample was sent aside for Gram stain and culture. The catheter was secured to the skin with 0 silk suture and sterile bandages were applied. Repeat CT imaging demonstrates no decrease in the other 2 loculated fluid collections consistent with the suspicion the day are indeed completely separate. Therefore, plans were made to proceed with additional chest tube placement. CHEST TUBE #2 The posterior apical fluid collection was targeted next. A suitable skin entry site was selected and marked. The skin was sterilely prepped and draped in the standard fashion using chlorhexidine skin prep. Local anesthesia was attained by infiltration with 1% lidocaine. A small dermatotomy was made. A 22 gauge spinal needle was advanced and imaging obtained to confirm an appropriate trajectory between the ribs and into the fluid collection. Once this was confirmed, a 10 [place] all-purpose drainage catheter advanced over the rib and into the pleural space using trocar technique. The catheter was connected to low wall suction via a Sahara device. Aspiration yields 80 mL of  opaque foul-smelling purulent bloody fluid. The catheter was secured to the skin with 0 silk suture and sterile bandages were applied. CHEST TUBE #3 The posterior apical fluid collection was targeted next. A suitable skin entry site was selected and marked. The skin was sterilely prepped and draped in the standard fashion using chlorhexidine skin prep. Local anesthesia was attained by infiltration with 1% lidocaine. A small dermatotomy was made. A 22 gauge spinal needle was advanced and imaging obtained to confirm an appropriate trajectory between the ribs and into the fluid collection. Once this was confirmed, a 10 [place] all-purpose drainage catheter advanced over the rib and into the pleural space using trocar technique. Approximately 800 mL of foul-smelling purulent fluid was then aspirated using a 60 cc syringe and an abscess evacuation bag. The drainage catheter was then connected to waterseal. The catheter was secured to the skin with 0 silk suture and sterile bandages were applied. IMPRESSION: 1. Placement of a total of 3 percutaneous pigtail chest tubes into the left pleural space (Tube #1, a 12 [place] via the left lower lateral chest into the most inferior and largest collection; tube #2, 10 [place] in the posterior apex, Tube #3, 43F in the left upper anterior collection). 2. Overall, at least 1,800 mL of foul-smelling purulent fluid was aspirated between the 3 drainage catheters. A sample was sent for Gram stain and culture. Electronically Signed   By: Malachy Moan M.D.   On: 09/22/2022 17:59  Scheduled Meds:  amiodarone  400 mg Oral BID   Chlorhexidine Gluconate Cloth  6 each Topical Daily   sodium chloride flush  10 mL Intrapleural Q8H   sodium chloride flush  3 mL Intravenous Q12H   Continuous Infusions:  sodium chloride 125 mL/hr at 09/24/22 0731   sodium chloride Stopped (09/23/22 0942)   azithromycin Stopped (09/23/22 1910)   cefTRIAXone (ROCEPHIN)  IV Stopped (09/23/22  1836)   metronidazole Stopped (09/24/22 1021)     LOS: 3 days    Time spent: 35 minutes    Dorcas Carrow, MD [facility] Pager [phone]

## 2022-09-24 NOTE — Progress Notes (Signed)
Rounding Note    Patient Name: Ryan Reynolds Date of Encounter: 09/24/2022  [place] Cardiologist: None   Subjective   He still has multiple chest tubes. Feeling ok.   Inpatient Medications    Scheduled Meds:  alteplase (CATHFLO ACTIVASE) 10 mg in sodium chloride (PF) 0.9 % 30 mL  10 mg Intrapleural Once   And   dornase alfa (PULMOZYME) 5 mg in sterile water (preservative free) 30 mL  5 mg Intrapleural Once   amiodarone  400 mg Oral BID   Chlorhexidine Gluconate Cloth  6 each Topical Daily   sodium chloride flush  10 mL Intrapleural Q8H   sodium chloride flush  10 mL Intrapleural Q8H   sodium chloride flush  3 mL Intravenous Q12H   Continuous Infusions:  sodium chloride 125 mL/hr at 09/24/22 0731   sodium chloride Stopped (09/23/22 0942)   azithromycin Stopped (09/23/22 1910)   cefTRIAXone (ROCEPHIN)  IV Stopped (09/23/22 1836)   metronidazole Stopped (09/24/22 1021)   PRN Meds: acetaminophen **OR** acetaminophen, fentaNYL (SUBLIMAZE) injection, HYDROcodone-acetaminophen, lip balm, ondansetron **OR** ondansetron (ZOFRAN) IV, mouth rinse, phenol, senna-docusate   Vital Signs    Vitals:   09/24/22 0630 09/24/22 0700 09/24/22 0800 09/24/22 0900  BP: (!) 119/48 (!) 98/51 (!) 107/54 (!) 110/56  Pulse: 63 66 65 65  Resp: 19 18 (!) 21 20  Temp:   97.6 F (36.4 C)   TempSrc:   Oral   SpO2: 92% 94% 97% 94%  Weight:      Height:        Intake/Output Summary (Last 24 hours) at 09/24/2022 1132 Last data filed at 09/24/2022 0900 Gross per 24 hour  Intake 3097.18 ml  Output 921 ml  Net 2176.18 ml      Oct 06, 2022    5:33 PM 01/08/2018    2:02 PM 07/10/2017    1:51 PM  Last 3 Weights  Weight (lbs) 180 lb 12.4 oz 181 lb 176 lb 9.6 oz  Weight (kg) 82 kg 82.101 kg 80.105 kg      Telemetry    Atrial flutter with variable block - Personally Reviewed  ECG    Atrial flutter, rate 100 bpm, with variable AV conduction and RBBB.   - Personally  Reviewed  Physical Exam   Vitals:   09/24/22 0800 09/24/22 0900  BP: (!) 107/54 (!) 110/56  Pulse: 65 65  Resp: (!) 21 20  Temp: 97.6 F (36.4 C)   SpO2: 97% 94%     General: 87 y.o. thin cachetic Caucasian male resting comfortably in no acute distress. HEENT: Normocephalic and atraumatic. EOMs intact. Neck: Supple. No JVD. Heart: Borderline bradycardic with irregularly irregular rhythm.  No murmurs, gallops, or rubs.  Lungs: No significant increased work of breathing. Decreased breath sound on the left. No wheezes, rhonchi, or rales appreciated. Abdomen: Soft, non-distended, and non-tender to palpation.  Extremities: No lower extremity edema.    Skin: Warm and dry. Neuro: Alert and oriented x3. No focal deficits. Psych: Normal affect. Responds appropriately.  Labs    High Sensitivity Troponin:   Recent Labs  Lab 2022-10-06 1752 10/06/2022 2000  TROPONINIHS 24* 20*     Chemistry Recent Labs  Lab 09/22/22 0430 09/22/22 2137 09/23/22 0258 09/24/22 0309  NA 136 140 137 137  K 4.9 5.0 4.6 4.3  CL 104 109 109 110  CO2 24 23 21* 21*  GLUCOSE 130* 104* 121* 97  BUN 49* 47* 48* 40*  CREATININE 1.11 1.05 0.95  0.91  CALCIUM 8.9 8.5* 8.2* 8.0*  MG 1.7  --  2.1 1.6*  PROT 5.2* 4.9*  --  4.4*  ALBUMIN 1.8* 1.7*  --  <1.5*  AST 18 19  --  22  ALT 25 23  --  23  ALKPHOS 61 61  --  49  BILITOT 0.5 0.6  --  0.4  GFRNONAA >60 >60 >60 >60  ANIONGAP 8 8 7 6     Lipids No results for input(s): "CHOL", "TRIG", "HDL", "LABVLDL", "LDLCALC", "CHOLHDL" in the last 168 hours.  Hematology Recent Labs  Lab 09/22/22 2137 09/23/22 0258 09/24/22 0309  WBC 28.7* 20.8* 14.1*  RBC 3.68* 3.68* 3.56*  HGB 10.6* 10.8* 10.1*  HCT 36.7* 35.6* 33.9*  MCV 99.7 96.7 95.2  MCH 28.8 29.3 28.4  MCHC 28.9* 30.3 29.8*  RDW 13.5 13.4 13.6  PLT 430* 409* 336   Thyroid  Recent Labs  Lab 09/23/22 0258  TSH 0.673    BNP Recent Labs  Lab Sep 27, 2022 1753  BNP 156.1*    DDimer No results  for input(s): "DDIMER" in the last 168 hours.   Radiology    DG CHEST PORT 1 VIEW  Result Date: 09/24/2022 CLINICAL DATA:  11/23/2022 with empyema, chest tubes. EXAM: PORTABLE CHEST 1 VIEW COMPARISON:  Portable chest yesterday at 7:54 a.m., chest CT 2022-09-27 without contrast. FINDINGS: 4:12 a.m. There are 3 left pigtail chest tube stable in positioning. No visible pneumothorax. There is increased opacity in the left lower chest which could be due to increasing consolidation or reaccumulating pleural fluid. There is increasing haziness along side the chest tube in the lateral left upper to mid lung field which could also be due to airspace disease or accumulating fluid. On the right, there is increased small to moderate pleural effusion and increased patchy atelectasis or consolidation in the right lower lung field. The cardiac size is stable. There is mild central vascular fullness without overt edema. Right upper lung field remains clear. Mild S shaped thoracic scoliosis. IMPRESSION: 1. Increased opacity in the left lower chest which could be due to increasing consolidation or reaccumulating pleural fluid. 2. Increased haziness along side the chest tube in the lateral left upper to mid lung field which could also be due to airspace disease or reaccumulating fluid. 3. Increased small to moderate right pleural effusion and increased patchy atelectasis or consolidation in the right lower lung field. Electronically Signed   By: 11/20/2022 M.D.   On: 09/24/2022 06:38   ECHOCARDIOGRAM COMPLETE  Result Date: 09/23/2022    ECHOCARDIOGRAM REPORT   Patient Name:   Ryan Reynolds Date of Exam: 09/23/2022 Medical Rec #:  11/22/2022        Height:       72.0 in Accession #:    [id number]       Weight:       180.8 lb Date of Birth:  1935/10/08       BSA:          2.041 m Patient Age:    86 years         BP:           98/57 mmHg Patient Gender: M                HR:           64 bpm. Exam Location:  Inpatient Procedure:  2D Echo, Cardiac Doppler and Color Doppler Indications:    R94.31 Abnormal EKG  History:  Patient has no prior history of Echocardiogram examinations.                 Arrythmias:Atrial Fibrillation.  Sonographer:    Eulah Pont RDCS Referring Phys: [id number] [facility]  Sonographer Comments: Image acquisition challenging due to patient body habitus. IMPRESSIONS  1. Left ventricular ejection fraction, by estimation, is 60 to 65%. The left ventricle has normal function. The left ventricle has no regional wall motion abnormalities. Left ventricular diastolic function could not be evaluated.  2. Right ventricular systolic function is normal. The right ventricular size is mildly enlarged. There is mildly elevated pulmonary artery systolic pressure. The estimated right ventricular systolic pressure is 41.6 mmHg.  3. Right atrial size was mildly dilated.  4. The mitral valve is degenerative. Mild mitral valve regurgitation. No evidence of mitral stenosis. Moderate mitral annular calcification.  5. The aortic valve is tricuspid. There is moderate calcification of the aortic valve. There is moderate thickening of the aortic valve. Aortic valve regurgitation is trivial. Mild aortic valve stenosis. Aortic valve area, by VTI measures 2.51 cm. Aortic valve mean gradient measures 10.0 mmHg. Aortic valve Vmax measures 2.23 m/s.  6. There is mild dilatation of the ascending aorta, measuring 41 mm.  7. The inferior vena cava is dilated in size with <50% respiratory variability, suggesting right atrial pressure of 15 mmHg. FINDINGS  Left Ventricle: Left ventricular ejection fraction, by estimation, is 60 to 65%. The left ventricle has normal function. The left ventricle has no regional wall motion abnormalities. The left ventricular internal cavity size was normal in size. There is  no left ventricular hypertrophy. Left ventricular diastolic function could not be evaluated due to atrial fibrillation. Left ventricular  diastolic function could not be evaluated. Right Ventricle: The right ventricular size is mildly enlarged. No increase in right ventricular wall thickness. Right ventricular systolic function is normal. There is mildly elevated pulmonary artery systolic pressure. The tricuspid regurgitant velocity is 2.58 m/s, and with an assumed right atrial pressure of 15 mmHg, the estimated right ventricular systolic pressure is 41.6 mmHg. Left Atrium: Left atrial size was normal in size. Right Atrium: Right atrial size was mildly dilated. Pericardium: Trivial pericardial effusion is present. Mitral Valve: The mitral valve is degenerative in appearance. Moderate mitral annular calcification. Mild mitral valve regurgitation. No evidence of mitral valve stenosis. Tricuspid Valve: The tricuspid valve is grossly normal. Tricuspid valve regurgitation is mild . No evidence of tricuspid stenosis. Aortic Valve: The aortic valve is tricuspid. There is moderate calcification of the aortic valve. There is moderate thickening of the aortic valve. Aortic valve regurgitation is trivial. Mild aortic stenosis is present. Aortic valve mean gradient measures 10.0 mmHg. Aortic valve peak gradient measures 19.9 mmHg. Aortic valve area, by VTI measures 2.51 cm. Pulmonic Valve: The pulmonic valve was grossly normal. Pulmonic valve regurgitation is trivial. No evidence of pulmonic stenosis. Aorta: The aortic root is normal in size and structure. There is mild dilatation of the ascending aorta, measuring 41 mm. Venous: The inferior vena cava is dilated in size with less than 50% respiratory variability, suggesting right atrial pressure of 15 mmHg. IAS/Shunts: The atrial septum is grossly normal.  LEFT VENTRICLE PLAX 2D LVIDd:         3.50 cm   Diastology LVIDs:         1.80 cm   LV e' medial:    9.20 cm/s LV PW:         1.00 cm   LV E/e'  medial:  15.4 LV IVS:        1.00 cm   LV e' lateral:   9.11 cm/s LVOT diam:     2.30 cm   LV E/e' lateral: 15.6 LV  SV:         114 LV SV Index:   56 LVOT Area:     4.15 cm  RIGHT VENTRICLE RV S prime:     14.70 cm/s TAPSE (M-mode): 2.0 cm LEFT ATRIUM           Index        RIGHT ATRIUM           Index LA diam:      4.40 cm 2.16 cm/m   RA Area:     18.10 cm LA Vol (A2C): 46.5 ml 22.78 ml/m  RA Volume:   44.70 ml  21.90 ml/m LA Vol (A4C): 57.1 ml 27.98 ml/m  AORTIC VALVE AV Area (Vmax):    2.36 cm AV Area (Vmean):   2.26 cm AV Area (VTI):     2.51 cm AV Vmax:           223.00 cm/s AV Vmean:          149.000 cm/s AV VTI:            0.455 m AV Peak Grad:      19.9 mmHg AV Mean Grad:      10.0 mmHg LVOT Vmax:         126.50 cm/s LVOT Vmean:        81.050 cm/s LVOT VTI:          0.274 m LVOT/AV VTI ratio: 0.60  AORTA Ao Root diam: 3.70 cm Ao Asc diam:  4.10 cm MITRAL VALVE                TRICUSPID VALVE MV Area (PHT): 4.39 cm     TR Peak grad:   26.6 mmHg MV Decel Time: 173 msec     TR Vmax:        258.00 cm/s MR Peak grad: 30.1 mmHg MR Mean grad: 23.0 mmHg     SHUNTS MR Vmax:      274.50 cm/s   Systemic VTI:  0.27 m MR Vmean:     233.0 cm/s    Systemic Diam: 2.30 cm MV E velocity: 142.00 cm/s MV A velocity: 50.70 cm/s MV E/A ratio:  2.80 Eleonore Chiquito MD Electronically signed by Eleonore Chiquito MD Signature Date/Time: 09/23/2022/4:44:56 PM    Final    DG CHEST PORT 1 VIEW  Result Date: 09/23/2022 CLINICAL DATA:  Pneumonia EXAM: PORTABLE CHEST 1 VIEW COMPARISON:  Radiograph 09/22/2022 FINDINGS: There are 2 left apical chest tubes and a left basilar chest tube unchanged in position. Unchanged residual pleural effusion and adjacent atelectasis. Unchanged small right pleural effusion. Unchanged small lucency in the left peripheral lung base consistent with pneumothorax, and with adjacent pleural thickening. Thoracic spondylosis. Bones are unchanged. IMPRESSION: Unchanged left pleural effusion with small basilar gas component and multiple chest tubes in place. Unchanged adjacent atelectasis. Unchanged small right pleural effusion  and atelectasis. Electronically Signed   By: Maurine Simmering M.D.   On: 09/23/2022 08:23   DG CHEST PORT 1 VIEW  Result Date: 09/22/2022 CLINICAL DATA:  Chest tube placement. EXAM: PORTABLE CHEST 1 VIEW COMPARISON:  10/06/2022. FINDINGS: Three pigtail chest tubes have been inserted on the left, 1 projecting over the upper medial aspect of the hemithorax, another over the mid to  upper lateral hemithorax and the third over the inferior, medial left hemithorax. There has been significant improvement with a notable decrease in opacity in the left mid and upper lung consistent with evacuation of loculated pleural effusions. Opacity at the left base has improved. Small left lateral lung base pneumothorax, likely ex vacuole. No apical pneumothorax. There is opacity at the right lung base that has developed since the previous day's study. This is likely a combination of a small effusion and atelectasis. Remainder of the right lung is clear. IMPRESSION: 1. Significant interval improvement in left lung aeration after placement of 3 left hemithorax chest tubes. 2. Small left inferior pneumothorax. 3. New opacity at the right lung base suspected to be a small effusion with atelectasis. Electronically Signed   By: Amie Portland M.D.   On: 09/22/2022 21:16   CT [facility] PLEURAL DRAIN W/INDWELL CATH W/IMG GUIDE  Result Date: 09/22/2022 INDICATION: 87 year old male with pneumonia and complex multiloculated left pleural effusion concerning for empyema. EXAM: CT-GUIDED CHEST TUBE PLACEMENT CT-GUIDED CHEST TUBE PLACEMENT CT-GUIDED CHEST TUBE PLACEMENT TECHNIQUE: Multidetector CT imaging of the CHEST was performed following the standard protocol WITHOUT IV contrast. RADIATION DOSE REDUCTION: This exam was performed according to the departmental dose-optimization program which includes automated exposure control, adjustment of the mA and/or kV according to patient size and/or use of iterative reconstruction technique. MEDICATIONS: The  patient is currently admitted to the hospital and receiving intravenous antibiotics. The antibiotics were administered within an appropriate time frame prior to the initiation of the procedure. ANESTHESIA/SEDATION: Moderate (conscious) sedation was employed during this procedure. A total of Versed 1 mg and Fentanyl 50 mcg was administered intravenously by the radiology nurse. Total intra-service moderate Sedation Time: 58 minutes. The patient's level of consciousness and vital signs were monitored continuously by radiology nursing throughout the procedure under my direct supervision. COMPLICATIONS: None immediate. PROCEDURE: Informed written consent was obtained from the patient after a thorough discussion of the procedural risks, benefits and alternatives. All questions were addressed. Maximal Sterile Barrier Technique was utilized including caps, mask, sterile gowns, sterile gloves, sterile drape, hand hygiene and skin antiseptic. A timeout was performed prior to the initiation of the procedure. CHEST TUBE #1 A planning axial CT scan was performed. The largest fluid collection in the more inferior aspect of the lung was targeted. A suitable skin entry site was selected and marked. The skin was sterilely prepped and draped in the standard fashion using chlorhexidine skin prep. Local anesthesia was attained by infiltration with 1% lidocaine. A small dermatotomy was made. A 22 gauge spinal needle was advanced and imaging obtained to confirm an appropriate trajectory between the ribs and into the fluid collection. Once this was confirmed, a 24 [place] all-purpose drainage catheter modified with additional sideholes was advanced over the rib and into the pleural space using trocar technique. The catheter was connected to low wall suction via a Sahara device. Aspiration yields at least 800 mL of opaque foul-smelling purulent fluid. A sample was sent aside for Gram stain and culture. The catheter was secured to the skin  with 0 silk suture and sterile bandages were applied. Repeat CT imaging demonstrates no decrease in the other 2 loculated fluid collections consistent with the suspicion the day are indeed completely separate. Therefore, plans were made to proceed with additional chest tube placement. CHEST TUBE #2 The posterior apical fluid collection was targeted next. A suitable skin entry site was selected and marked. The skin was sterilely prepped and draped in the  standard fashion using chlorhexidine skin prep. Local anesthesia was attained by infiltration with 1% lidocaine. A small dermatotomy was made. A 22 gauge spinal needle was advanced and imaging obtained to confirm an appropriate trajectory between the ribs and into the fluid collection. Once this was confirmed, a 10 [place] all-purpose drainage catheter advanced over the rib and into the pleural space using trocar technique. The catheter was connected to low wall suction via a Sahara device. Aspiration yields 80 mL of opaque foul-smelling purulent bloody fluid. The catheter was secured to the skin with 0 silk suture and sterile bandages were applied. CHEST TUBE #3 The posterior apical fluid collection was targeted next. A suitable skin entry site was selected and marked. The skin was sterilely prepped and draped in the standard fashion using chlorhexidine skin prep. Local anesthesia was attained by infiltration with 1% lidocaine. A small dermatotomy was made. A 22 gauge spinal needle was advanced and imaging obtained to confirm an appropriate trajectory between the ribs and into the fluid collection. Once this was confirmed, a 10 [place] all-purpose drainage catheter advanced over the rib and into the pleural space using trocar technique. Approximately 800 mL of foul-smelling purulent fluid was then aspirated using a 60 cc syringe and an abscess evacuation bag. The drainage catheter was then connected to waterseal. The catheter was secured to the skin with 0 silk suture  and sterile bandages were applied. IMPRESSION: 1. Placement of a total of 3 percutaneous pigtail chest tubes into the left pleural space (Tube #1, a 12 [place] via the left lower lateral chest into the most inferior and largest collection; tube #2, 10 [place] in the posterior apex, Tube #3, 72F in the left upper anterior collection). 2. Overall, at least 1,800 mL of foul-smelling purulent fluid was aspirated between the 3 drainage catheters. A sample was sent for Gram stain and culture. Electronically Signed   By: Malachy Moan M.D.   On: 09/22/2022 17:59   CT [facility] PLEURAL DRAIN W/INDWELL CATH W/IMG GUIDE  Result Date: 09/22/2022 INDICATION: 87 year old male with pneumonia and complex multiloculated left pleural effusion concerning for empyema. EXAM: CT-GUIDED CHEST TUBE PLACEMENT CT-GUIDED CHEST TUBE PLACEMENT CT-GUIDED CHEST TUBE PLACEMENT TECHNIQUE: Multidetector CT imaging of the CHEST was performed following the standard protocol WITHOUT IV contrast. RADIATION DOSE REDUCTION: This exam was performed according to the departmental dose-optimization program which includes automated exposure control, adjustment of the mA and/or kV according to patient size and/or use of iterative reconstruction technique. MEDICATIONS: The patient is currently admitted to the hospital and receiving intravenous antibiotics. The antibiotics were administered within an appropriate time frame prior to the initiation of the procedure. ANESTHESIA/SEDATION: Moderate (conscious) sedation was employed during this procedure. A total of Versed 1 mg and Fentanyl 50 mcg was administered intravenously by the radiology nurse. Total intra-service moderate Sedation Time: 58 minutes. The patient's level of consciousness and vital signs were monitored continuously by radiology nursing throughout the procedure under my direct supervision. COMPLICATIONS: None immediate. PROCEDURE: Informed written consent was obtained from the patient after a  thorough discussion of the procedural risks, benefits and alternatives. All questions were addressed. Maximal Sterile Barrier Technique was utilized including caps, mask, sterile gowns, sterile gloves, sterile drape, hand hygiene and skin antiseptic. A timeout was performed prior to the initiation of the procedure. CHEST TUBE #1 A planning axial CT scan was performed. The largest fluid collection in the more inferior aspect of the lung was targeted. A suitable skin entry site was selected and marked.  The skin was sterilely prepped and draped in the standard fashion using chlorhexidine skin prep. Local anesthesia was attained by infiltration with 1% lidocaine. A small dermatotomy was made. A 22 gauge spinal needle was advanced and imaging obtained to confirm an appropriate trajectory between the ribs and into the fluid collection. Once this was confirmed, a 36 [place] all-purpose drainage catheter modified with additional sideholes was advanced over the rib and into the pleural space using trocar technique. The catheter was connected to low wall suction via a Sahara device. Aspiration yields at least 800 mL of opaque foul-smelling purulent fluid. A sample was sent aside for Gram stain and culture. The catheter was secured to the skin with 0 silk suture and sterile bandages were applied. Repeat CT imaging demonstrates no decrease in the other 2 loculated fluid collections consistent with the suspicion the day are indeed completely separate. Therefore, plans were made to proceed with additional chest tube placement. CHEST TUBE #2 The posterior apical fluid collection was targeted next. A suitable skin entry site was selected and marked. The skin was sterilely prepped and draped in the standard fashion using chlorhexidine skin prep. Local anesthesia was attained by infiltration with 1% lidocaine. A small dermatotomy was made. A 22 gauge spinal needle was advanced and imaging obtained to confirm an appropriate trajectory  between the ribs and into the fluid collection. Once this was confirmed, a 10 [place] all-purpose drainage catheter advanced over the rib and into the pleural space using trocar technique. The catheter was connected to low wall suction via a Sahara device. Aspiration yields 80 mL of opaque foul-smelling purulent bloody fluid. The catheter was secured to the skin with 0 silk suture and sterile bandages were applied. CHEST TUBE #3 The posterior apical fluid collection was targeted next. A suitable skin entry site was selected and marked. The skin was sterilely prepped and draped in the standard fashion using chlorhexidine skin prep. Local anesthesia was attained by infiltration with 1% lidocaine. A small dermatotomy was made. A 22 gauge spinal needle was advanced and imaging obtained to confirm an appropriate trajectory between the ribs and into the fluid collection. Once this was confirmed, a 10 [place] all-purpose drainage catheter advanced over the rib and into the pleural space using trocar technique. Approximately 800 mL of foul-smelling purulent fluid was then aspirated using a 60 cc syringe and an abscess evacuation bag. The drainage catheter was then connected to waterseal. The catheter was secured to the skin with 0 silk suture and sterile bandages were applied. IMPRESSION: 1. Placement of a total of 3 percutaneous pigtail chest tubes into the left pleural space (Tube #1, a 12 [place] via the left lower lateral chest into the most inferior and largest collection; tube #2, 10 [place] in the posterior apex, Tube #3, 76F in the left upper anterior collection). 2. Overall, at least 1,800 mL of foul-smelling purulent fluid was aspirated between the 3 drainage catheters. A sample was sent for Gram stain and culture. Electronically Signed   By: Malachy Moan M.D.   On: 09/22/2022 17:59   CT [facility] PLEURAL DRAIN W/INDWELL CATH W/IMG GUIDE  Result Date: 09/22/2022 INDICATION: 87 year old male with pneumonia and  complex multiloculated left pleural effusion concerning for empyema. EXAM: CT-GUIDED CHEST TUBE PLACEMENT CT-GUIDED CHEST TUBE PLACEMENT CT-GUIDED CHEST TUBE PLACEMENT TECHNIQUE: Multidetector CT imaging of the CHEST was performed following the standard protocol WITHOUT IV contrast. RADIATION DOSE REDUCTION: This exam was performed according to the departmental dose-optimization program which includes automated exposure  control, adjustment of the mA and/or kV according to patient size and/or use of iterative reconstruction technique. MEDICATIONS: The patient is currently admitted to the hospital and receiving intravenous antibiotics. The antibiotics were administered within an appropriate time frame prior to the initiation of the procedure. ANESTHESIA/SEDATION: Moderate (conscious) sedation was employed during this procedure. A total of Versed 1 mg and Fentanyl 50 mcg was administered intravenously by the radiology nurse. Total intra-service moderate Sedation Time: 58 minutes. The patient's level of consciousness and vital signs were monitored continuously by radiology nursing throughout the procedure under my direct supervision. COMPLICATIONS: None immediate. PROCEDURE: Informed written consent was obtained from the patient after a thorough discussion of the procedural risks, benefits and alternatives. All questions were addressed. Maximal Sterile Barrier Technique was utilized including caps, mask, sterile gowns, sterile gloves, sterile drape, hand hygiene and skin antiseptic. A timeout was performed prior to the initiation of the procedure. CHEST TUBE #1 A planning axial CT scan was performed. The largest fluid collection in the more inferior aspect of the lung was targeted. A suitable skin entry site was selected and marked. The skin was sterilely prepped and draped in the standard fashion using chlorhexidine skin prep. Local anesthesia was attained by infiltration with 1% lidocaine. A small dermatotomy was  made. A 22 gauge spinal needle was advanced and imaging obtained to confirm an appropriate trajectory between the ribs and into the fluid collection. Once this was confirmed, a 42 [place] all-purpose drainage catheter modified with additional sideholes was advanced over the rib and into the pleural space using trocar technique. The catheter was connected to low wall suction via a Sahara device. Aspiration yields at least 800 mL of opaque foul-smelling purulent fluid. A sample was sent aside for Gram stain and culture. The catheter was secured to the skin with 0 silk suture and sterile bandages were applied. Repeat CT imaging demonstrates no decrease in the other 2 loculated fluid collections consistent with the suspicion the day are indeed completely separate. Therefore, plans were made to proceed with additional chest tube placement. CHEST TUBE #2 The posterior apical fluid collection was targeted next. A suitable skin entry site was selected and marked. The skin was sterilely prepped and draped in the standard fashion using chlorhexidine skin prep. Local anesthesia was attained by infiltration with 1% lidocaine. A small dermatotomy was made. A 22 gauge spinal needle was advanced and imaging obtained to confirm an appropriate trajectory between the ribs and into the fluid collection. Once this was confirmed, a 10 [place] all-purpose drainage catheter advanced over the rib and into the pleural space using trocar technique. The catheter was connected to low wall suction via a Sahara device. Aspiration yields 80 mL of opaque foul-smelling purulent bloody fluid. The catheter was secured to the skin with 0 silk suture and sterile bandages were applied. CHEST TUBE #3 The posterior apical fluid collection was targeted next. A suitable skin entry site was selected and marked. The skin was sterilely prepped and draped in the standard fashion using chlorhexidine skin prep. Local anesthesia was attained by infiltration with 1%  lidocaine. A small dermatotomy was made. A 22 gauge spinal needle was advanced and imaging obtained to confirm an appropriate trajectory between the ribs and into the fluid collection. Once this was confirmed, a 10 [place] all-purpose drainage catheter advanced over the rib and into the pleural space using trocar technique. Approximately 800 mL of foul-smelling purulent fluid was then aspirated using a 60 cc syringe and an abscess evacuation  bag. The drainage catheter was then connected to waterseal. The catheter was secured to the skin with 0 silk suture and sterile bandages were applied. IMPRESSION: 1. Placement of a total of 3 percutaneous pigtail chest tubes into the left pleural space (Tube #1, a 12 [place] via the left lower lateral chest into the most inferior and largest collection; tube #2, 10 [place] in the posterior apex, Tube #3, 46F in the left upper anterior collection). 2. Overall, at least 1,800 mL of foul-smelling purulent fluid was aspirated between the 3 drainage catheters. A sample was sent for Gram stain and culture. Electronically Signed   By: Malachy Moan M.D.   On: 09/22/2022 17:59    Cardiac Studies   1. Left ventricular ejection fraction, by estimation, is 60 to 65%. The  left ventricle has normal function. The left ventricle has no regional  wall motion abnormalities. Left ventricular diastolic function could not  be evaluated.   2. Right ventricular systolic function is normal. The right ventricular  size is mildly enlarged. There is mildly elevated pulmonary artery  systolic pressure. The estimated right ventricular systolic pressure is  41.6 mmHg.   3. Right atrial size was mildly dilated.   4. The mitral valve is degenerative. Mild mitral valve regurgitation. No  evidence of mitral stenosis. Moderate mitral annular calcification.   5. The aortic valve is tricuspid. There is moderate calcification of the  aortic valve. There is moderate thickening of the aortic valve.  Aortic  valve regurgitation is trivial. Mild aortic valve stenosis. Aortic valve  area, by VTI measures 2.51 cm.  Aortic valve mean gradient measures 10.0 mmHg. Aortic valve Vmax measures  2.23 m/s.   6. There is mild dilatation of the ascending aorta, measuring 41 mm.   7. The inferior vena cava is dilated in size with <50% respiratory  variability, suggesting right atrial pressure of 15 mmHg.   Patient Profile     Ryan Reynolds is a 87 y.o. male with no known past medical history other than tobacco use who is being seen 09/23/2022 for the evaluation of atrial flutter at the request of Dr. Jerral Ralph. At [facility] with a complex PNA s/p multiple chest tubes.   Assessment & Plan    Atrial Flutter: new onset.  He is in atrial flutter with variable block. Rates controlled on IV amiodarone. Can transition to oral amiodarone 400 mg BID for 10 days, 200 mg daily thereafter. This is a risk of stroke not on [facility], however his rates have been elevated and status tenuous with PNA.  Can start eliquis 5 mg BID once tubes are removed. Otherwise he has rate controlled flutter, cardiology will sign off.   Time Spent Directly with Patient:  I have spent a total of 35 minutes with the patient reviewing hospital notes, telemetry, EKGs, labs and examining the patient as well as establishing an assessment and plan that was discussed personally with the patient.  > 50% of time was spent in direct patient care.   For questions or updates, please contact [facility] Please consult [email or web address] for contact info under        Signed, Maisie Fus, MD  09/24/2022, 11:32 AM

## 2022-09-24 NOTE — Procedures (Signed)
Pleural Fibrinolytic Administration Procedure Note  Ryan Reynolds  [id number]  [birth year]  Date:09/24/22  Time:12:29 PM   Provider Performing:Lissie Hinesley Jerilynn Mages Verlee Monte   Procedure: Pleural Fibrinolysis Initial day [phone])  Indication(s) Fibrinolysis of complicated pleural effusion  Consent Risks of the procedure as well as the alternatives and risks of each were explained to the patient and/or caregiver.  Consent for the procedure was obtained.   Anesthesia None   Time Out Verified patient identification, verified procedure, site/side was marked, verified correct patient position, special equipment/implants available, medications/allergies/relevant history reviewed, required imaging and test results available.   Sterile Technique Hand hygiene, gloves   Procedure Description Existing pleural catheter was cleaned and accessed in sterile manner.  10mg  of tPA in 30cc of saline and 5mg  of dornase in 30cc of sterile water were injected into pleural space using existing pleural catheter.  Catheter will be clamped for 1 hour and then placed back to suction.   Complications/Tolerance None; patient tolerated the procedure well.  EBL None   Specimen(s) None

## 2022-09-25 ENCOUNTER — Inpatient Hospital Stay (HOSPITAL_COMMUNITY): Payer: Medicare Other

## 2022-09-25 DIAGNOSIS — R6520 Severe sepsis without septic shock: Secondary | ICD-10-CM | POA: Diagnosis not present

## 2022-09-25 DIAGNOSIS — A419 Sepsis, unspecified organism: Secondary | ICD-10-CM | POA: Diagnosis not present

## 2022-09-25 LAB — CBC WITH DIFFERENTIAL/PLATELET
Abs Immature Granulocytes: 0.55 10*3/uL — ABNORMAL HIGH (ref 0.00–0.07)
Basophils Absolute: 0.1 10*3/uL (ref 0.0–0.1)
Basophils Relative: 1 %
Eosinophils Absolute: 0 10*3/uL (ref 0.0–0.5)
Eosinophils Relative: 0 %
HCT: 35.1 % — ABNORMAL LOW (ref 39.0–52.0)
Hemoglobin: 10.6 g/dL — ABNORMAL LOW (ref 13.0–17.0)
Immature Granulocytes: 3 %
Lymphocytes Relative: 5 %
Lymphs Abs: 0.9 10*3/uL (ref 0.7–4.0)
MCH: 28.6 pg (ref 26.0–34.0)
MCHC: 30.2 g/dL (ref 30.0–36.0)
MCV: 94.9 fL (ref 80.0–100.0)
Monocytes Absolute: 1.1 10*3/uL — ABNORMAL HIGH (ref 0.1–1.0)
Monocytes Relative: 6 %
Neutro Abs: 15.5 10*3/uL — ABNORMAL HIGH (ref 1.7–7.7)
Neutrophils Relative %: 85 %
Platelets: 348 10*3/uL (ref 150–400)
RBC: 3.7 MIL/uL — ABNORMAL LOW (ref 4.22–5.81)
RDW: 13.7 % (ref 11.5–15.5)
WBC: 18.2 10*3/uL — ABNORMAL HIGH (ref 4.0–10.5)
nRBC: 0 % (ref 0.0–0.2)

## 2022-09-25 LAB — COMPREHENSIVE METABOLIC PANEL
ALT: 22 U/L (ref 0–44)
AST: 18 U/L (ref 15–41)
Albumin: 1.6 g/dL — ABNORMAL LOW (ref 3.5–5.0)
Alkaline Phosphatase: 51 U/L (ref 38–126)
Anion gap: 5 (ref 5–15)
BUN: 34 mg/dL — ABNORMAL HIGH (ref 8–23)
CO2: 20 mmol/L — ABNORMAL LOW (ref 22–32)
Calcium: 8 mg/dL — ABNORMAL LOW (ref 8.9–10.3)
Chloride: 111 mmol/L (ref 98–111)
Creatinine, Ser: 0.88 mg/dL (ref 0.61–1.24)
GFR, Estimated: >60 mL/min (ref >60)
Glucose, Bld: 112 mg/dL — ABNORMAL HIGH (ref 70–99)
Potassium: 4.4 mmol/L (ref 3.5–5.1)
Sodium: 136 mmol/L (ref 135–145)
Total Bilirubin: 0.4 mg/dL (ref 0.3–1.2)
Total Protein: 4.6 g/dL — ABNORMAL LOW (ref 6.5–8.1)

## 2022-09-25 LAB — MAGNESIUM: Magnesium: 1.9 mg/dL (ref 1.7–2.4)

## 2022-09-25 MED ORDER — SODIUM CHLORIDE 0.9 % IV SOLN
2.0000 g | INTRAVENOUS | Status: DC
  Administered 2022-09-25: 2 g via INTRAVENOUS
  Filled 2022-09-25: qty 20

## 2022-09-25 MED ORDER — SODIUM CHLORIDE 0.9% FLUSH
10.0000 mL | Freq: Three times a day (TID) | INTRAVENOUS | Status: DC
  Administered 2022-09-25 – 2022-09-28 (×7): 10 mL via INTRAPLEURAL

## 2022-09-25 MED ORDER — SODIUM CHLORIDE (PF) 0.9 % IJ SOLN
10.0000 mg | Freq: Once | INTRAMUSCULAR | Status: AC
  Administered 2022-09-25: 10 mg via INTRAPLEURAL
  Filled 2022-09-25: qty 10

## 2022-09-25 MED ORDER — STERILE WATER FOR INJECTION IJ SOLN
5.0000 mg | Freq: Once | RESPIRATORY_TRACT | Status: AC
  Administered 2022-09-25: 5 mg via INTRAPLEURAL
  Filled 2022-09-25: qty 5

## 2022-09-25 NOTE — Progress Notes (Addendum)
PROGRESS NOTE    MONTRELLE EDDINGS  OVF:[id number]  DOB: 07-30-1936  DOA: 10/15/2022 PCP: Patient, No Pcp Per Outpatient Specialists:   Hospital course:  87 year old gentleman with history of previous smoking presented with more than 2 weeks of cough and fatigue, 50 pound weight loss for last 1 year. Chest x-ray showed complete opacification of the left hemithorax, CT scan of the chest showed large loculated left pleural effusion.  Case was discussed with pulmonary, recommended IR for diagnostic and therapeutic chest tube placement.  1/8, IR placed 3 pigtail catheters and drained more than 2 L of pus. Patient went into rapid a flutter but not symptomatic. Overnight blood pressures low, started on peripheral Levophed.   1/10, Levophed off.   Rate controlled on amiodarone.  Chest tube actively draining. First Pleural Fibrionolysis 1/11 Second fibrinolysis     Subjective:  Patient is unhappy about having to lie on side after fibrinolysis. Otherwise no new complaints.   Objective: Vitals:   09/25/22 0800 09/25/22 0900 09/25/22 1000 09/25/22 1110  BP: 99/60 (!) 107/56 (!) 104/57   Pulse: 72 65 64   Resp: 15 16 16    Temp:    97.8 F (36.6 C)  TempSrc:    Oral  SpO2: 96% 94% 94%   Weight:      Height:        Intake/Output Summary (Last 24 hours) at 09/25/2022 1224 Last data filed at 09/25/2022 8416 Gross per 24 hour  Intake 3867.56 ml  Output 2215 ml  Net 1652.56 ml   Filed Weights   10/03/2022 1733  Weight: 82 kg     Exam:  General: Chronically ill appearing man lying in bed positioned in left lateral decub position, speaking comfortably but irritably with attentive and patient wife.  Eyes: sclera anicteric, conjuctiva mild injection bilaterally CVS: S1-S2, regular  Respiratory:  decreased air entry bilaterally, chest tube in place  GI: NABS, soft, NT  LE: Warm and well-perfused Neuro: A/O x 3,  grossly nonfocal.  Psych: patient is logical and coherent, judgement  and insight appear normal, mood and affect appropriate to situation.  Data Reviewed:  Basic Metabolic Panel: Recent Labs  Lab 10/14/2022 1752 09/22/22 0430 09/22/22 2137 09/23/22 0258 09/24/22 0309 09/25/22 0243  NA 134* 136 140 137 137 136  K 5.1 4.9 5.0 4.6 4.3 4.4  CL 101 104 109 109 110 111  CO2 24 24 23  21* 21* 20*  GLUCOSE 188* 130* 104* 121* 97 112*  BUN 50* 49* 47* 48* 40* 34*  CREATININE 1.38* 1.11 1.05 0.95 0.91 0.88  CALCIUM 9.6 8.9 8.5* 8.2* 8.0* 8.0*  MG 1.3* 1.7  --  2.1 1.6* 1.9  PHOS  --   --   --  3.8  --   --     CBC: Recent Labs  Lab 09/22/22 0430 09/22/22 2137 09/23/22 0258 09/24/22 0309 09/25/22 0243  WBC 26.7* 28.7* 20.8* 14.1* 18.2*  NEUTROABS  --   --  17.8* 12.0* 15.5*  HGB 10.9* 10.6* 10.8* 10.1* 10.6*  HCT 35.1* 36.7* 35.6* 33.9* 35.1*  MCV 92.1 99.7 96.7 95.2 94.9  PLT 412* 430* 409* 336 348     Scheduled Meds:  amiodarone  400 mg Oral BID   Chlorhexidine Gluconate Cloth  6 each Topical Daily   sodium chloride flush  10 mL Intrapleural Q8H   sodium chloride flush  10 mL Intrapleural Q8H   sodium chloride flush  10 mL Intrapleural Q8H   sodium chloride flush  3 mL Intravenous Q12H   Continuous Infusions:  sodium chloride 125 mL/hr at 09/25/22 0818   sodium chloride Stopped (09/23/22 0942)   cefTRIAXone (ROCEPHIN)  IV     metronidazole Stopped (09/25/22 1053)     Assessment & Plan:   Empyema Patient is on day #2 fibrinolysis, remains on ceftriaxone and Flagyl Chest tube is draining well Continue supportive measures including chest PT, spirometry, mucolytic's WBC has increased back up to 18 although trend is generally downwards from last week  Appreciate PCCM management  New onset a flutter with RVR Rate is now controlled on amiodarone per cardiology, presently on 400 twice daily, will need to be decreased to 200 daily after 10-day load Echocardiogram with normal EF No secondary prevention with anticoagulation due to chest  tube and fibrinolysis  Hypomagnesemia Normalized with repletion  Patient is DNR   DVT prophylaxis: SCD Code Status: DNR Family Communication: Loving wife is at bedside  Studies: DG CHEST PORT 1 VIEW  Result Date: 09/25/2022 CLINICAL DATA:  Empyema EXAM: PORTABLE CHEST 1 VIEW COMPARISON:  09/24/2022 FINDINGS: Three left pleural drainage catheters are unchanged in position. Continued pleural thickening on the left with hazy opacification of the left hemithorax, and scattered airspace opacity in the left lung most concentrated in the left lower lobe. There is also some volume loss in the left hemithorax. Mild blunting of the right lateral costophrenic angle with hazy density along the right lower chest unchanged from prior. Borderline enlargement of the cardiopericardial silhouette. Mildly indistinct pulmonary vasculature. Overall the appearance of the chest is not substantially changed from yesterday's exam. IMPRESSION: 1. No significant change from yesterday's exam. Three left pleural drainage catheters are unchanged in position. 2. Continued pleural thickening on the left with hazy opacification of the left hemithorax and scattered airspace opacity in the left lung. 3. Borderline enlargement of the cardiopericardial silhouette with mildly indistinct pulmonary vasculature. Electronically Signed   By: Van Clines M.D.   On: 09/25/2022 08:09   DG CHEST PORT 1 VIEW  Result Date: 09/24/2022 CLINICAL DATA:  [id number] with empyema, chest tubes. EXAM: PORTABLE CHEST 1 VIEW COMPARISON:  Portable chest yesterday at 7:54 a.m., chest CT 10/12/2022 without contrast. FINDINGS: 4:12 a.m. There are 3 left pigtail chest tube stable in positioning. No visible pneumothorax. There is increased opacity in the left lower chest which could be due to increasing consolidation or reaccumulating pleural fluid. There is increasing haziness along side the chest tube in the lateral left upper to mid lung field which could  also be due to airspace disease or accumulating fluid. On the right, there is increased small to moderate pleural effusion and increased patchy atelectasis or consolidation in the right lower lung field. The cardiac size is stable. There is mild central vascular fullness without overt edema. Right upper lung field remains clear. Mild S shaped thoracic scoliosis. IMPRESSION: 1. Increased opacity in the left lower chest which could be due to increasing consolidation or reaccumulating pleural fluid. 2. Increased haziness along side the chest tube in the lateral left upper to mid lung field which could also be due to airspace disease or reaccumulating fluid. 3. Increased small to moderate right pleural effusion and increased patchy atelectasis or consolidation in the right lower lung field. Electronically Signed   By: Telford Nab M.D.   On: 09/24/2022 06:38   ECHOCARDIOGRAM COMPLETE  Result Date: 09/23/2022    ECHOCARDIOGRAM REPORT   Patient Name:   RYUN VELEZ Date of Exam: 09/23/2022 Medical Rec #:  [id number]        Height:       72.0 in Accession #:    [id number]       Weight:       180.8 lb Date of Birth:  1936/01/30       BSA:          2.041 m Patient Age:    86 years         BP:           98/57 mmHg Patient Gender: M                HR:           64 bpm. Exam Location:  Inpatient Procedure: 2D Echo, Cardiac Doppler and Color Doppler Indications:    R94.31 Abnormal EKG  History:        Patient has no prior history of Echocardiogram examinations.                 Arrythmias:Atrial Fibrillation.  Sonographer:    Eulah Pont RDCS Referring Phys: [id number] [facility]  Sonographer Comments: Image acquisition challenging due to patient body habitus. IMPRESSIONS  1. Left ventricular ejection fraction, by estimation, is 60 to 65%. The left ventricle has normal function. The left ventricle has no regional wall motion abnormalities. Left ventricular diastolic function could not be evaluated.  2. Right ventricular  systolic function is normal. The right ventricular size is mildly enlarged. There is mildly elevated pulmonary artery systolic pressure. The estimated right ventricular systolic pressure is 41.6 mmHg.  3. Right atrial size was mildly dilated.  4. The mitral valve is degenerative. Mild mitral valve regurgitation. No evidence of mitral stenosis. Moderate mitral annular calcification.  5. The aortic valve is tricuspid. There is moderate calcification of the aortic valve. There is moderate thickening of the aortic valve. Aortic valve regurgitation is trivial. Mild aortic valve stenosis. Aortic valve area, by VTI measures 2.51 cm. Aortic valve mean gradient measures 10.0 mmHg. Aortic valve Vmax measures 2.23 m/s.  6. There is mild dilatation of the ascending aorta, measuring 41 mm.  7. The inferior vena cava is dilated in size with <50% respiratory variability, suggesting right atrial pressure of 15 mmHg. FINDINGS  Left Ventricle: Left ventricular ejection fraction, by estimation, is 60 to 65%. The left ventricle has normal function. The left ventricle has no regional wall motion abnormalities. The left ventricular internal cavity size was normal in size. There is  no left ventricular hypertrophy. Left ventricular diastolic function could not be evaluated due to atrial fibrillation. Left ventricular diastolic function could not be evaluated. Right Ventricle: The right ventricular size is mildly enlarged. No increase in right ventricular wall thickness. Right ventricular systolic function is normal. There is mildly elevated pulmonary artery systolic pressure. The tricuspid regurgitant velocity is 2.58 m/s, and with an assumed right atrial pressure of 15 mmHg, the estimated right ventricular systolic pressure is 41.6 mmHg. Left Atrium: Left atrial size was normal in size. Right Atrium: Right atrial size was mildly dilated. Pericardium: Trivial pericardial effusion is present. Mitral Valve: The mitral valve is degenerative  in appearance. Moderate mitral annular calcification. Mild mitral valve regurgitation. No evidence of mitral valve stenosis. Tricuspid Valve: The tricuspid valve is grossly normal. Tricuspid valve regurgitation is mild . No evidence of tricuspid stenosis. Aortic Valve: The aortic valve is tricuspid. There is moderate calcification of the aortic valve. There is moderate thickening of the aortic valve. Aortic valve regurgitation is trivial. Mild aortic  stenosis is present. Aortic valve mean gradient measures 10.0 mmHg. Aortic valve peak gradient measures 19.9 mmHg. Aortic valve area, by VTI measures 2.51 cm. Pulmonic Valve: The pulmonic valve was grossly normal. Pulmonic valve regurgitation is trivial. No evidence of pulmonic stenosis. Aorta: The aortic root is normal in size and structure. There is mild dilatation of the ascending aorta, measuring 41 mm. Venous: The inferior vena cava is dilated in size with less than 50% respiratory variability, suggesting right atrial pressure of 15 mmHg. IAS/Shunts: The atrial septum is grossly normal.  LEFT VENTRICLE PLAX 2D LVIDd:         3.50 cm   Diastology LVIDs:         1.80 cm   LV e' medial:    9.20 cm/s LV PW:         1.00 cm   LV E/e' medial:  15.4 LV IVS:        1.00 cm   LV e' lateral:   9.11 cm/s LVOT diam:     2.30 cm   LV E/e' lateral: 15.6 LV SV:         114 LV SV Index:   56 LVOT Area:     4.15 cm  RIGHT VENTRICLE RV S prime:     14.70 cm/s TAPSE (M-mode): 2.0 cm LEFT ATRIUM           Index        RIGHT ATRIUM           Index LA diam:      4.40 cm 2.16 cm/m   RA Area:     18.10 cm LA Vol (A2C): 46.5 ml 22.78 ml/m  RA Volume:   44.70 ml  21.90 ml/m LA Vol (A4C): 57.1 ml 27.98 ml/m  AORTIC VALVE AV Area (Vmax):    2.36 cm AV Area (Vmean):   2.26 cm AV Area (VTI):     2.51 cm AV Vmax:           223.00 cm/s AV Vmean:          149.000 cm/s AV VTI:            0.455 m AV Peak Grad:      19.9 mmHg AV Mean Grad:      10.0 mmHg LVOT Vmax:         126.50 cm/s LVOT  Vmean:        81.050 cm/s LVOT VTI:          0.274 m LVOT/AV VTI ratio: 0.60  AORTA Ao Root diam: 3.70 cm Ao Asc diam:  4.10 cm MITRAL VALVE                TRICUSPID VALVE MV Area (PHT): 4.39 cm     TR Peak grad:   26.6 mmHg MV Decel Time: 173 msec     TR Vmax:        258.00 cm/s MR Peak grad: 30.1 mmHg MR Mean grad: 23.0 mmHg     SHUNTS MR Vmax:      274.50 cm/s   Systemic VTI:  0.27 m MR Vmean:     233.0 cm/s    Systemic Diam: 2.30 cm MV E velocity: 142.00 cm/s MV A velocity: 50.70 cm/s MV E/A ratio:  2.80 Lennie Odor MD Electronically signed by Lennie Odor MD Signature Date/Time: 09/23/2022/4:44:56 PM    Final     Principal Problem:   Severe sepsis [facility]) Active Problems:   Loculated left pleural effusion  Acute respiratory failure with hypoxia (HCC)   Atrial fibrillation (HCC)   Hypomagnesemia   Renal insufficiency   Thyroid nodule greater than or equal to 1.5 cm in diameter incidentally noted on imaging study   Pressure injury of skin     Halden Phegley Tublu Destina Mantei, [facility]  If 7PM-7AM, please contact night-coverage [email or web address]   LOS: 4 days

## 2022-09-25 NOTE — Evaluation (Signed)
Physical Therapy Evaluation Patient Details Name: Ryan Reynolds MRN: [id number] DOB: 01-26-1936 Today's Date: 09/25/2022  History of Present Illness  87 year old gentleman with history of previous smoking presented 09/22/22 with more than 2 weeks of cough and fatigue, 50 pound weight loss for last 1 year. Chest x-ray showed complete opacification of the left hemithorax, CT scan of the chest showed large loculated left pleural effusion.  On 1/8, IR placed 3 pigtail catheters and drained more than 2 L of pus.  Patient went into rapid a flutter .  Clinical Impression  Pt admitted with above diagnosis.  Pt currently with functional limitations due to the deficits listed below (see PT Problem List). Pt will benefit from skilled PT to increase their independence and safety with mobility to allow discharge to the venue listed below.     The patient received resting in bed, noted 3 Chest tubes.  Patient agreeable to mobilizing. Patient  required min assistance to   move to sitting on bed edge , sat for ~ 5 minutes. Assisted back into bed. Patient reporting  pain the  right  elbow, noted UE edema.  Patient remained on 3 LPM, SPO2 > 93%, HR 86, RR 22, BP seated 107/56.  Patient reports mild dizziness and fatigue, but  tolerated well. Continue PT for mobility.     Recommendations for follow up therapy are one component of a multi-disciplinary discharge planning process, led by the attending physician.  Recommendations may be updated based on patient status, additional functional criteria and insurance authorization.  Follow Up Recommendations Skilled nursing-short term rehab (<3 hours/day) Can patient physically be transported by private vehicle: No    Assistance Recommended at Discharge Frequent or constant Supervision/Assistance  Patient can return home with the following  Two people to help with walking and/or transfers;A lot of help with bathing/dressing/bathroom;Help with stairs or ramp for  entrance;Assistance with cooking/housework    Equipment Recommendations None recommended by PT  Recommendations for Other Services       Functional Status Assessment Patient has had a recent decline in their functional status and demonstrates the ability to make significant improvements in function in a reasonable and predictable amount of time.     Precautions / Restrictions Precautions Precautions: Fall Precaution Comments: 3 left chest tubes, monitor VS Restrictions Weight Bearing Restrictions: No      Mobility  Bed Mobility               General bed mobility comments: transferred to edge of bed, assist with legs and scooting  to edge, assisted legs back onto bed    Transfers                        Ambulation/Gait                  Stairs            Wheelchair Mobility    Modified Rankin (Stroke Patients Only)       Balance               Standing balance comment: NT                             Pertinent Vitals/Pain Pain Assessment Pain Assessment: Faces (Simultaneous filing. User may not have seen previous data.) Faces Pain Scale: Hurts little more Pain Location: right elbow Pain Descriptors / Indicators: Discomfort Pain Intervention(s): Monitored during session, Repositioned  Home Living Family/patient expects to be discharged to:: Private residence Living Arrangements: Spouse/significant other Available Help at Discharge: Family;Available 24 hours/day Type of Home: House Home Access: Stairs to enter   [employer] of Steps: 1   Home Layout: One level Home Equipment: Conservation officer, nature (2 wheels);Shower seat      Prior Function Prior Level of Function : Independent/Modified Independent             Mobility Comments: drives       Hand Dominance   Dominant Hand: Right    Extremity/Trunk Assessment   Upper Extremity Assessment Upper Extremity Assessment: Generalized weakness     Lower Extremity Assessment Lower Extremity Assessment: Generalized weakness    Cervical / Trunk Assessment Cervical / Trunk Assessment: Kyphotic  Communication   Communication: No difficulties  Cognition Arousal/Alertness: Awake/alert (Simultaneous filing. User may not have seen previous data.) Behavior During Therapy: [facility] for tasks assessed/performed (Simultaneous filing. User may not have seen previous data.)                                   General Comments: Patient needed increased time to process information, seemed somewhat confused initially after being woke up        General Comments      Exercises     Assessment/Plan    PT Assessment Patient needs continued PT services  PT Problem List Decreased strength;Decreased knowledge of precautions;Decreased mobility;Cardiopulmonary status limiting activity;Decreased activity tolerance       PT Treatment Interventions DME instruction    PT Goals (Current goals can be found in the Care Plan section)  Acute Rehab PT Goals Patient Stated Goal: agreed to sitting up PT Goal Formulation: With patient Time For Goal Achievement: 10/09/22 Potential to Achieve Goals: Fair    Frequency Min 2X/week     Co-evaluation PT/OT/SLP Co-Evaluation/Treatment: Yes Reason for Co-Treatment: Complexity of the patient's impairments (multi-system involvement);To address functional/ADL transfers PT goals addressed during session: Mobility/safety with mobility OT goals addressed during session: ADL's and self-care       AM-PAC PT "6 Clicks" Mobility  Outcome Measure Help needed turning from your back to your side while in a flat bed without using bedrails?: A Lot Help needed moving from lying on your back to sitting on the side of a flat bed without using bedrails?: A Lot Help needed moving to and from a bed to a chair (including a wheelchair)?: A Lot Help needed standing up from a chair using your arms (e.g., wheelchair  or bedside chair)?: Total Help needed to walk in hospital room?: Total Help needed climbing 3-5 steps with a railing? : Total 6 Click Score: 9    End of Session Equipment Utilized During Treatment: Oxygen Activity Tolerance: Patient tolerated treatment well Patient left: in bed;with call bell/phone within reach;with nursing/sitter in room Nurse Communication: Mobility status PT Visit Diagnosis: Unsteadiness on feet (R26.81);Difficulty in walking, not elsewhere classified (R26.2)    Time: 7619-5093 PT Time Calculation (min) (ACUTE ONLY): 31 min   Charges:   PT Evaluation $PT Eval Low Complexity: 1 Low PT Treatments $Therapeutic Activity: 8-22 mins        Tresa Endo PT Acute Rehabilitation Services Office [phone] Weekend [phone]   Claretha Cooper 09/25/2022, 1:39 PM

## 2022-09-25 NOTE — Progress Notes (Addendum)
   NAME:  Ryan Reynolds, MRN:  [id number], DOB:  02/03/1936, LOS: 4 ADMISSION DATE:  08-Oct-2022, CONSULTATION DATE:  09/23/22 REFERRING MD:  TRH, CHIEF COMPLAINT:  hypotension   History of Present Illness:  87 yo with no known pmh with exception of tobacoo use per chart review presents with cough and fatigue found to have suspected empyema, pneumonia and ? Of underlying malignancy. Pt was started on empiric abx, IR consulted and pt had 3 chest tubes placed with large volume output over past 24 hours. He was taken to [facility] for close monitoring when he began hypotensive for which CCM was consulted today.   Upon evaluation pt was on 27mcg of levo (previously 9) and alert oriented. He declined CVC placement stating that he feels we are on the "upswing" and reiterating his "DNR" status.  Cx and cytology pending for pleural fluid. Cont volume resuscitation as well as abx therapy.   He denies complaints stating that his chest pain actually resolved with chest tube placement, no fever chills, slight cough but endorses it is improved from prior to presentation as well.   Pertinent  Medical History  Tobacco use (ceassation 11/23)  Significant Hospital Events: Including procedures, antibiotic start and stop dates in addition to other pertinent events   Admitted October 08, 2022 Chest tube placed 01/08 Ccm consulted 01/09 1/10 lytics dose 1 though basilar tube  Interim History / Subjective:  No acute issues  Objective   Blood pressure 99/60, pulse 72, temperature 97.9 F (36.6 C), temperature source Axillary, resp. rate 15, height 6' (1.829 m), weight 82 kg, SpO2 96 %.        Intake/Output Summary (Last 24 hours) at 09/25/2022 0917 Last data filed at 09/25/2022 0818 Gross per 24 hour  Intake 3867.56 ml  Output 1915 ml  Net 1952.56 ml   Filed Weights   10/08/2022 1733  Weight: 82 kg    Examination: General appearance: 87 y.o., male, frail Eyes: tracking appropriately HENT: NCAT; MMM Lungs: rhonchi bl,  with normal respiratory effort CV: IRIR, no murmur  Abdomen: Soft, non-tender; non-distended, BS present  Extremities: No peripheral edema, warm Skin: Normal turgor and texture; no rash Neuro: grossly nonfocal  No air leaks in tubes.  Cyto neg  CXR to me with slightly improved aeration  Resolved Hospital Problem list   Shock resolved  Assessment & Plan:    Left empyema s/p placement of 3 pigtail chest tubes 1/8:  Left mainstem endobronchial occlusion: Cytology negative. -would continue at least ceftriaxone for now, anticipate need for 4 week course of ABX -2nd dose lytics today - will instill via apical tube today -CXR tomorrow -repeat CT Chest following drainage -will probably plan to keep all 3 tubes till repeat CT chest in case he could potentially benefit from more lytics through any 1 particular tube -agree with flutter valve, SLP evaluation. Can also try to position left lung up for postural drainage but need to ensure care to not dislodge tubes.   Best Practice (right click and "Reselect all SmartList Selections" daily)   Per primary  Critical care time: 27min

## 2022-09-25 NOTE — Evaluation (Signed)
Occupational Therapy Evaluation Patient Details Name: Ryan Reynolds MRN: [id number] DOB: 03-21-1936 Today's Date: 09/25/2022   History of Present Illness 87 year old gentleman with history of previous smoking presented 09/22/22 with more than 2 weeks of cough and fatigue, 50 pound weight loss for last 1 year. Chest x-ray showed complete opacification of the left hemithorax, CT scan of the chest showed large loculated left pleural effusion.  On 1/8, IR placed 3 pigtail catheters and drained more than 2 L of pus.  Patient went into rapid a flutter .   Clinical Impression   Ryan Reynolds is an 87 year old man who presents with pain, generalized weakness, decreased activity tolerance and impaired cardiopulmonary endurance on 3 L [place] resulting in a sudden decline in functional abilities. Patient at baseline is independent without a device. Today patient limited to just transferring to the edge of the bed due to the presence of three chest tubes. Vital signs stable. He needed physical assistance and multimodal cues to move his body due to some difficulty following directions. He is currently requiring min-mod assist +2 for bed mobility and mostly mod-total assist for ADLs at bed level. Patient will benefit from skilled OT services while in hospital to improve deficits and learn compensatory strategies as needed in order to return to PLOF.       Recommendations for follow up therapy are one component of a multi-disciplinary discharge planning process, led by the attending physician.  Recommendations may be updated based on patient status, additional functional criteria and insurance authorization.   Follow Up Recommendations  Skilled nursing-short term rehab (<3 hours/day)     Assistance Recommended at Discharge Frequent or constant Supervision/Assistance  Patient can return home with the following A lot of help with walking and/or transfers;A lot of help with bathing/dressing/bathroom;Assistance  with cooking/housework;Help with stairs or ramp for entrance    Functional Status Assessment  Patient has had a recent decline in their functional status and demonstrates the ability to make significant improvements in function in a reasonable and predictable amount of time.  Equipment Recommendations  None recommended by OT    Recommendations for Other Services       Precautions / Restrictions Precautions Precautions: Fall Precaution Comments: 3 left chest tubes, monitor VS Restrictions Weight Bearing Restrictions: No      Mobility Bed Mobility Overal bed mobility: Needs Assistance Bed Mobility: Supine to Sit, Sit to Supine     Supine to sit: Min assist, +2 for physical assistance, HOB elevated Sit to supine: Mod assist, +2 for physical assistance   General bed mobility comments: transferred to edge of bed    Transfers                          Balance Overall balance assessment: Needs assistance Sitting-balance support: No upper extremity supported, Feet unsupported Sitting balance-Leahy Scale: Fair                                     ADL either performed or assessed with clinical judgement   ADL Overall ADL's : Needs assistance/impaired Eating/Feeding: Set up;Bed level   Grooming: Set up;Bed level   Upper Body Bathing: Moderate assistance;Bed level   Lower Body Bathing: Total assistance;Bed level   Upper Body Dressing : Bed level;Moderate assistance   Lower Body Dressing: Total assistance;Bed level     Toilet Transfer Details (indicate cue  type and reason): deferred Toileting- Clothing Manipulation and Hygiene: Total assistance;Bed level               Vision Baseline Vision/History: 1 Wears glasses Patient Visual Report: No change from baseline       Perception     Praxis      Pertinent Vitals/Pain       Hand Dominance Right   Extremity/Trunk Assessment Upper Extremity Assessment Upper Extremity Assessment:  Generalized weakness   Lower Extremity Assessment Lower Extremity Assessment: Defer to PT evaluation   Cervical / Trunk Assessment Cervical / Trunk Assessment: Kyphotic   Communication Communication Communication: No difficulties   Cognition     Overall Cognitive Status: No family/caregiver present to determine baseline cognitive functioning (Simultaneous filing. User may not have seen previous data.)                                 General Comments: Patient needed increased time to process information, seemed somewhat confused initially after being woke up     General Comments       Exercises     Shoulder Instructions      Home Living Family/patient expects to be discharged to:: Private residence Living Arrangements: Spouse/significant other Available Help at Discharge: Family;Available 24 hours/day Type of Home: House Home Access: Stairs to enter [employer] of Steps: 1   Home Layout: One level     Bathroom Shower/Tub: Walk-in shower;Tub/shower unit   Bathroom Toilet: Standard     Home Equipment: Conservation officer, nature (2 wheels);Shower seat          Prior Functioning/Environment Prior Level of Function : Independent/Modified Independent             Mobility Comments: drives          OT Problem List: Decreased strength;Decreased activity tolerance;Cardiopulmonary status limiting activity;Pain;Decreased knowledge of use of DME or AE      OT Treatment/Interventions: Self-care/ADL training;Therapeutic exercise;DME and/or AE instruction;Therapeutic activities;Balance training;Patient/family education    OT Goals(Current goals can be found in the care plan section) Acute Rehab OT Goals Patient Stated Goal: did not state OT Goal Formulation: With patient Time For Goal Achievement: 10/09/22 Potential to Achieve Goals: Fair  OT Frequency: Min 2X/week    Co-evaluation PT/OT/SLP Co-Evaluation/Treatment: Yes (co-eval) Reason for  Co-Treatment: Complexity of the patient's impairments (multi-system involvement);To address functional/ADL transfers PT goals addressed during session: Mobility/safety with mobility OT goals addressed during session: ADL's and self-care      AM-PAC OT "6 Clicks" Daily Activity     Outcome Measure Help from another person eating meals?: A Little Help from another person taking care of personal grooming?: A Little Help from another person toileting, which includes using toliet, bedpan, or urinal?: Total Help from another person bathing (including washing, rinsing, drying)?: A Lot Help from another person to put on and taking off regular upper body clothing?: A Lot Help from another person to put on and taking off regular lower body clothing?: Total 6 Click Score: 12   End of Session Equipment Utilized During Treatment: Oxygen Nurse Communication: Mobility status  Activity Tolerance: Patient tolerated treatment well Patient left: in bed;with call bell/phone within reach  OT Visit Diagnosis: Muscle weakness (generalized) (M62.81)                Time: 1610-9604 OT Time Calculation (min): 30 min Charges:  OT General Charges $OT Visit: 1 Visit OT Evaluation $OT  Eval Low Complexity: 1 Low  Donnella Sham, OTR/L Acute Care Rehab Services  Office [phone]   Kelli Churn 09/25/2022, 1:20 PM

## 2022-09-25 NOTE — Procedures (Signed)
Pleural Fibrinolytic Administration Procedure Note  Ryan Reynolds  [id number]  Jan 22, 1936  Date:09/25/22  Time:10:33 AM   Provider Performing:Brooke Moshe Cipro   Procedure: Pleural Fibrinolysis Subsequent day [phone]) #2  Indication(s) Fibrinolysis of complicated pleural effusion  Consent Risks of the procedure as well as the alternatives and risks of each were explained to the patient and/or caregiver.  Consent for the procedure was obtained.   Anesthesia None   Time Out Verified patient identification, verified procedure, site/side was marked, verified correct patient position, special equipment/implants available, medications/allergies/relevant history reviewed, required imaging and test results available.   Sterile Technique Hand hygiene, gloves   Procedure Description Existing pleural catheter was cleaned and accessed in sterile manner.  10mg  of tPA in 30cc of saline and 5mg  of dornase in 30cc of sterile water were injected into pleural space using existing pleural catheter> apical CT.  All catheters will be clamped for 1 hour with frequent position changes, and then placed back to suction.  Communicated with bedside RN of plan   Complications/Tolerance None; patient tolerated the procedure well.> patient unable to lay flat, at best is 30 degrees    EBL None   Specimen(s) None     Kennieth Rad, ANCP  Pulmonary & Critical Care 09/25/2022, 10:35 AM  See Amion for pager If no response to pager, please call PCCM consult pager After 7:00 pm call Elink

## 2022-09-25 NOTE — Progress Notes (Signed)
SLP Cancellation Note  Patient Details Name: Ryan Reynolds MRN: [id number] DOB: [birth year]   Cancelled treatment:       Reason Eval/Treat Not Completed: Other (comment) (open mouth breathing posture noted and pt asleep, will continue efforts) Kathleen Lime, MS [facility] SLP Acute Rehab Services Office [phone] Pager [phone]   Macario Golds 09/25/2022, 12:27 PM

## 2022-09-26 ENCOUNTER — Inpatient Hospital Stay (HOSPITAL_COMMUNITY): Payer: Medicare Other

## 2022-09-26 DIAGNOSIS — R6520 Severe sepsis without septic shock: Secondary | ICD-10-CM | POA: Diagnosis not present

## 2022-09-26 DIAGNOSIS — J90 Pleural effusion, not elsewhere classified: Secondary | ICD-10-CM | POA: Diagnosis not present

## 2022-09-26 DIAGNOSIS — R609 Edema, unspecified: Secondary | ICD-10-CM | POA: Diagnosis not present

## 2022-09-26 DIAGNOSIS — A419 Sepsis, unspecified organism: Secondary | ICD-10-CM | POA: Diagnosis not present

## 2022-09-26 DIAGNOSIS — I480 Paroxysmal atrial fibrillation: Secondary | ICD-10-CM

## 2022-09-26 DIAGNOSIS — J9601 Acute respiratory failure with hypoxia: Secondary | ICD-10-CM | POA: Diagnosis not present

## 2022-09-26 LAB — CULTURE, RESPIRATORY W GRAM STAIN: Gram Stain: NONE SEEN

## 2022-09-26 LAB — CBC WITH DIFFERENTIAL/PLATELET
Abs Immature Granulocytes: 0.96 10*3/uL — ABNORMAL HIGH (ref 0.00–0.07)
Basophils Absolute: 0.1 10*3/uL (ref 0.0–0.1)
Basophils Relative: 1 %
Eosinophils Absolute: 0 10*3/uL (ref 0.0–0.5)
Eosinophils Relative: 0 %
HCT: 37.8 % — ABNORMAL LOW (ref 39.0–52.0)
Hemoglobin: 11.1 g/dL — ABNORMAL LOW (ref 13.0–17.0)
Immature Granulocytes: 4 %
Lymphocytes Relative: 3 %
Lymphs Abs: 0.6 10*3/uL — ABNORMAL LOW (ref 0.7–4.0)
MCH: 28.5 pg (ref 26.0–34.0)
MCHC: 29.4 g/dL — ABNORMAL LOW (ref 30.0–36.0)
MCV: 96.9 fL (ref 80.0–100.0)
Monocytes Absolute: 1 10*3/uL (ref 0.1–1.0)
Monocytes Relative: 4 %
Neutro Abs: 20.7 10*3/uL — ABNORMAL HIGH (ref 1.7–7.7)
Neutrophils Relative %: 88 %
Platelets: 367 10*3/uL (ref 150–400)
RBC: 3.9 MIL/uL — ABNORMAL LOW (ref 4.22–5.81)
RDW: 13.9 % (ref 11.5–15.5)
WBC: 23.4 10*3/uL — ABNORMAL HIGH (ref 4.0–10.5)
nRBC: 0 % (ref 0.0–0.2)

## 2022-09-26 LAB — BODY FLUID CULTURE W GRAM STAIN

## 2022-09-26 LAB — CULTURE, BLOOD (SINGLE): Culture: NO GROWTH

## 2022-09-26 MED ORDER — METRONIDAZOLE 500 MG/100ML IV SOLN
500.0000 mg | Freq: Two times a day (BID) | INTRAVENOUS | Status: DC
  Administered 2022-09-26 – 2022-09-27 (×3): 500 mg via INTRAVENOUS
  Filled 2022-09-26 (×4): qty 100

## 2022-09-26 MED ORDER — SODIUM CHLORIDE 0.9 % IV SOLN
3.0000 g | Freq: Four times a day (QID) | INTRAVENOUS | Status: DC
  Administered 2022-09-26: 3 g via INTRAVENOUS
  Filled 2022-09-26: qty 8

## 2022-09-26 MED ORDER — MIDODRINE HCL 5 MG PO TABS
10.0000 mg | ORAL_TABLET | Freq: Three times a day (TID) | ORAL | Status: DC
  Administered 2022-09-26 – 2022-09-27 (×5): 10 mg via ORAL
  Filled 2022-09-26 (×5): qty 2

## 2022-09-26 MED ORDER — SULFAMETHOXAZOLE-TRIMETHOPRIM 400-80 MG/5ML IV SOLN
320.0000 mg | Freq: Three times a day (TID) | INTRAVENOUS | Status: DC
  Administered 2022-09-26 – 2022-09-28 (×5): 320 mg via INTRAVENOUS
  Filled 2022-09-26 (×7): qty 20

## 2022-09-26 NOTE — TOC Initial Note (Signed)
Transition of Care [facility]) - Initial/Assessment Note    Patient Details  Name: Ryan Reynolds MRN: [id number] Date of Birth: 20-Feb-1936  [facility] of [facility]) CM/SW Contact:    Roseanne Kaufman, RN Phone Number: 09/26/2022, 6:44 PM  Clinical Narrative:     PT has recommended short term SNF placement, however palliative has been consulted. TOC will continue to follow for discharge needs.  This RNCM called patient's wife phone number and reached recording that phone # is not in service.                 Barriers to Discharge: Continued Medical Work up   Patient Goals and CMS Choice            Expected Discharge Plan and Services       Living arrangements for the past 2 months: Single Family Home                                      Prior Living Arrangements/Services Living arrangements for the past 2 months: [place] Lives with:: Spouse                   Activities of Daily Living      Permission Sought/Granted                  Emotional Assessment           Psych Involvement: No (comment)  Admission diagnosis:  Sepsis ([place]) [A41.9] Severe sepsis ([place]) [A41.9, R65.20] Patient Active Problem List   Diagnosis Date Noted   Pressure injury of skin 09/23/2022   Loculated left pleural effusion 09/27/2022   Severe sepsis ([place]) 10/03/2022   Hypomagnesemia 09/19/2022   Renal insufficiency 09/29/2022   Atrial fibrillation ([place]) 09/27/2022   Acute respiratory failure with hypoxia ([place]) 10/05/2022   Thyroid nodule greater than or equal to 1.5 cm in diameter incidentally noted on imaging study 10/05/2022   Hearing loss in left ear 07/10/2017   Smoker 07/10/2017   Other headache syndrome 06/20/2017   PCP:  Patient, No Pcp Per Pharmacy:   [facility] [place], [place] - [address] [place] [address] Phone: [phone] Fax: [phone]     Social  Determinants of Health (SDOH) Social History: SDOH Screenings   Tobacco Use: High Risk (09/22/2022)   SDOH Interventions:     Readmission Risk Interventions     No data to display

## 2022-09-26 NOTE — Progress Notes (Signed)
Right upper extremity venous duplex has been completed. Preliminary results can be found in CV Proc through chart review.  Results were given to Dr. Tana Coast.  09/26/22 6:22 PM Ryan Reynolds RVT

## 2022-09-26 NOTE — Progress Notes (Addendum)
[facility]                                                                              Ryan Reynolds, is a 87 y.o. male, DOB - 02-20-1936, IWL:[id number] Admit date - 09/18/2022    Outpatient Primary MD for the patient is Patient, No Pcp Per  LOS - 5  days  Chief Complaint  Patient presents with   Weakness       Brief summary   Patient is a 87 year old male with history of smoking (quit 2 months ago) presented to ED with cough and fatigue.  Patient reported fatigue and lightheadedness that started 2 weeks prior to admission with progressive weakness and productive cough.  CT chest showed large loculated left-sided pleural effusion suspicious for empyema with malignant effusion not excluded. He was started on empiric antibiotics, IR consulted and patient had 3 chest tubes placed with large volume output. Patient will hypotension and new onset atrial flutter, CCM and cardiology were consulted.  Started on amiodarone. DNR status.  Significant Hospital Events:   1/8: Chest tube placed by IR x 3 1/09: CCM consulted, noted to have hypotension with new atrial flutter.  Cardiology consulted, started on amiodarone 1/10: lytics dose 1 though basilar tube 1/11 : pleural lytics via apical tube, dose 2     Assessment & Plan    Principal Problem:   Severe sepsis ([place]) with left-sided empyema status post placement of 3 pigtail chest tubes on 1/8 Left mainstem endobronchial occlusion, aspiration pneumonia -Cytology negative, PCCM following -Plan for third lytic dose today, CT chest -Aggressive pulmonary hygiene, I-S, flutter, PT, mobilize -Leukocytosis worsening 23.4 K, continue IV Rocephin, Flagyl, will need prolonged course of antibiotics therapy  Active Problems: New onset atrial flutter with RVR -Rate now controlled on amiodarone per cardiology -2D echo showed EF of 60 to 65%, no regional WMA -Currently on 400 mg twice daily for 10days, then decrease to 200 mg  daily thereafter. -Once tubes are removed, recommended to start Eliquis 5 mg twice daily  Hypotension -Has been off of Levophed on 1/10, borderline BP on normal saline at 125 cc/ hour -I's and O's with 7.7 L positive, will place on midodrine 10 mg 3 times daily, decrease IV fluids as tolerated  Dysphagia -Ongoing SLP evaluation, dysphagia 3 diet however not reportedly eating much -Poor status, FTT with ongoing risk of aspiration, palliative medicine consulted for GOC     Hypomagnesemia -Resolved   Acute kidney injury -Creatinine 1.38 on admission, likely due to sepsis and #1 -Patient was placed on IV fluid hydration, creatinine now improved  1.9 cm left thyroid nodule incidentally noted on imaging study -Outpatient follow-up   Addendum: 5:10 PM Notified by RN of right arm swelling, removed IV -rec'd elevate arm, Kpad, Venous doppler UE to r/o DVT/SVT (d/w vascular ) - dec IV fluids     Pressure injury of skin Pressure Injury 09/22/22 Hip Anterior;Left Stage 1 -  Intact skin with non-blanchable redness of a localized area usually over a bony prominence. old wound with slight discoloration and dry scabbing (Active)  09/22/22 1730  Location: Hip  Location Orientation: Anterior;Left  Staging: Stage 1 -  Intact skin with non-blanchable redness of a localized area usually over a bony prominence.  Wound Description (Comments): old wound with slight discoloration and dry scabbing  Present on Admission: Yes  Dressing Type None 09/26/22 0800     Pressure Injury 09/24/22 Coccyx Mid Stage 1 -  Intact skin with non-blanchable redness of a localized area usually over a bony prominence. Pink Blanchable (Active)  09/24/22 1326  Location: Coccyx  Location Orientation: Mid  Staging: Stage 1 -  Intact skin with non-blanchable redness of a localized area usually over a bony prominence.  Wound Description (Comments): Pink Blanchable  Present on Admission:   Dressing Type Foam - Lift dressing  to assess site every shift 09/26/22 0800    Code Status: DNR DVT Prophylaxis:  SCDs Start: 10/12/2022 2316 Level of Care: Level of care: ICU Family Communication:  Disposition Plan:      Remains inpatient appropriate:   Procedures:  Chest tube placement x3  Consultants:   Pulmonary critical care Interventional radiology Cardiology  Antimicrobials:   Anti-infectives (From admission, onward)    Start     Dose/Rate Route Frequency Ordered Stop   09/25/22 1800  cefTRIAXone (ROCEPHIN) 2 g in sodium chloride 0.9 % 100 mL IVPB        2 g 200 mL/hr over 30 Minutes Intravenous Every 24 hours 09/25/22 1145     09/23/22 1000  metroNIDAZOLE (FLAGYL) IVPB 500 mg        500 mg 100 mL/hr over 60 Minutes Intravenous Every 12 hours 09/23/22 0915     10/04/2022 1900  cefTRIAXone (ROCEPHIN) 2 g in sodium chloride 0.9 % 100 mL IVPB  Status:  Discontinued        2 g 200 mL/hr over 30 Minutes Intravenous Every 24 hours 10/01/2022 1852 09/25/22 1145   10/10/2022 1900  azithromycin (ZITHROMAX) 500 mg in sodium chloride 0.9 % 250 mL IVPB  Status:  Discontinued        500 mg 250 mL/hr over 60 Minutes Intravenous Every 24 hours 09/30/2022 1852 09/25/22 1144          Medications  amiodarone  400 mg Oral BID   Chlorhexidine Gluconate Cloth  6 each Topical Daily   sodium chloride flush  10 mL Intrapleural Q8H   sodium chloride flush  10 mL Intrapleural Q8H   sodium chloride flush  10 mL Intrapleural Q8H   sodium chloride flush  3 mL Intravenous Q12H      Subjective:   Ryan Reynolds was seen and examined today.  Very frail and debilitated, borderline BP.  Asking for water.  Multiple chest tubes in place.  Denies any nausea vomiting, abdominal pain.  Overnight no acute events  Objective:   Vitals:   09/26/22 0830 09/26/22 0900 09/26/22 0930 09/26/22 1000  BP: (!) 94/48 (!) 102/52 (!) 103/57 (!) 93/55  Pulse: 82 85 86 83  Resp: (!) 21 17 (!) 32 18  Temp:      TempSrc:      SpO2: 98% 98% 99% 98%   Weight:      Height:        Intake/Output Summary (Last 24 hours) at 09/26/2022 1044 Last data filed at 09/26/2022 1013 Gross per 24 hour  Intake 3263.23 ml  Output 1665 ml  Net 1598.23 ml     Wt Readings from Last 3 Encounters:  09/20/2022 82 kg  01/08/18 82.1 kg  07/10/17 80.1 kg     Exam General: Alert  and oriented x 3, frail and ill-appearing Cardiovascular: S1 S2 auscultated,  RRR Respiratory: Diminished breath sounds b/l, 3 chest tubes+ Gastrointestinal: Soft, nontender, nondistended, + bowel sounds Ext: no pedal edema bilaterally Neuro: no new FND's Psych: Normal affect     Data Reviewed:  I have personally reviewed following labs    CBC Lab Results  Component Value Date   WBC 23.4 (H) 09/26/2022   RBC 3.90 (L) 09/26/2022   HGB 11.1 (L) 09/26/2022   HCT 37.8 (L) 09/26/2022   MCV 96.9 09/26/2022   MCH 28.5 09/26/2022   PLT 367 09/26/2022   MCHC 29.4 (L) 09/26/2022   RDW 13.9 09/26/2022   LYMPHSABS 0.6 (L) 09/26/2022   MONOABS 1.0 09/26/2022   EOSABS 0.0 09/26/2022   BASOSABS 0.1 02/40/9735     Last metabolic panel Lab Results  Component Value Date   NA 136 09/25/2022   K 4.4 09/25/2022   CL 111 09/25/2022   CO2 20 (L) 09/25/2022   BUN 34 (H) 09/25/2022   CREATININE 0.88 09/25/2022   GLUCOSE 112 (H) 09/25/2022   GFRNONAA >60 09/25/2022   CALCIUM 8.0 (L) 09/25/2022   PHOS 3.8 09/23/2022   PROT 4.6 (L) 09/25/2022   ALBUMIN 1.6 (L) 09/25/2022   BILITOT 0.4 09/25/2022   ALKPHOS 51 09/25/2022   AST 18 09/25/2022   ALT 22 09/25/2022   ANIONGAP 5 09/25/2022    CBG (last 3)  No results for input(s): "GLUCAP" in the last 72 hours.    Coagulation Profile: Recent Labs  Lab 09/22/22 0900  INR 1.4*     Radiology Studies: I have personally reviewed the imaging studies  DG CHEST PORT 1 VIEW  Result Date: 09/26/2022 CLINICAL DATA:  Empyema. EXAM: PORTABLE CHEST 1 VIEW COMPARISON:  Chest radiograph 09/25/2022. FINDINGS: Three left-sided  pleural drainage catheters are unchanged in position. Continued pleural thickening on the left with slightly increased hazy opacification of the left lower lung and associated increase in volume loss. Unchanged blunting of the right costophrenic sulcus with associated hazy opacity over the right lower chest, likely pleural effusion. Stable cardiac and mediastinal contours. IMPRESSION: 1. Slightly increased hazy opacity and volume loss in the left lower lung. Three left-sided pleural drainage catheters are unchanged. 2. Otherwise stable radiographic appearance of the chest. Electronically Signed   By: Emmit Alexanders M.D   On: 09/26/2022 08:52   DG CHEST PORT 1 VIEW  Result Date: 09/25/2022 CLINICAL DATA:  Empyema EXAM: PORTABLE CHEST 1 VIEW COMPARISON:  09/24/2022 FINDINGS: Three left pleural drainage catheters are unchanged in position. Continued pleural thickening on the left with hazy opacification of the left hemithorax, and scattered airspace opacity in the left lung most concentrated in the left lower lobe. There is also some volume loss in the left hemithorax. Mild blunting of the right lateral costophrenic angle with hazy density along the right lower chest unchanged from prior. Borderline enlargement of the cardiopericardial silhouette. Mildly indistinct pulmonary vasculature. Overall the appearance of the chest is not substantially changed from yesterday's exam. IMPRESSION: 1. No significant change from yesterday's exam. Three left pleural drainage catheters are unchanged in position. 2. Continued pleural thickening on the left with hazy opacification of the left hemithorax and scattered airspace opacity in the left lung. 3. Borderline enlargement of the cardiopericardial silhouette with mildly indistinct pulmonary vasculature. Electronically Signed   By: Van Clines M.D.   On: 09/25/2022 08:09       Shakia Sebastiano M.D. [facility] 09/26/2022, 10:44 AM  Available  via Epic secure chat  7am-7pm After 7 pm, please refer to night coverage provider listed on amion.

## 2022-09-26 NOTE — Progress Notes (Signed)
NAME:  Ryan Reynolds, MRN:  [id number], DOB:  1936-01-11, LOS: 5 ADMISSION DATE:  October 01, 2022, CONSULTATION DATE:  09/23/22 REFERRING MD:  TRH, CHIEF COMPLAINT:  hypotension   History of Present Illness:  87 yo with no known pmh with exception of tobacoo use per chart review presents with cough and fatigue, 50lb weight loss over last year, found to have left sided empyema, pneumonia and question of underlying malignancy. Pt was started on empiric abx, IR consulted and pt had 3 chest tubes placed with large volume output. He was taken to [facility] for close monitoring when he began hypotensive for which CCM was consulted on 1/9.  Developed new onset Aflutter, cardiology consulted and started on amiodarone.  Pt declined CVL and reiterated his DNR status.  Evaluated by SLP and placed on dysphagia 3 diet.  Off levophed by 1/10.    Pertinent  Medical History  Tobacco use (ceassation 11/23)  Significant Hospital Events: Including procedures, antibiotic start and stop dates in addition to other pertinent events   Admitted 10/01/22 Chest tube placed 01/08 IR x 3 Ccm consulted 01/09, hypotensive on NE, new aflutter/ cards/ amio 1/10 lytics dose 1 though basilar tube 1/11 pleural lytics via apical tube, dose 2  Interim History / Subjective:  RN reports patient is not eating.  Patient c/o of ongoing SOB.  Remains on 1-2L.  910 ml/ 24hrs total drainage out of all CTs > still draining pus Afebrile WBC up slightly   Objective   Blood pressure (!) 94/48, pulse 82, temperature 98.1 F (36.7 C), temperature source Oral, resp. rate (!) 21, height 6' (1.829 m), weight 82 kg, SpO2 98 %.        Intake/Output Summary (Last 24 hours) at 09/26/2022 0910 Last data filed at 09/26/2022 3244 Gross per 24 hour  Intake 2733.19 ml  Output 1710 ml  Net 1023.19 ml   Filed Weights   2022-10-01 1733  Weight: 82 kg    Examination: General:  cachetic, frail elderly male sitting upright in bed in NAD HEENT: MM pink/dry,  cracked lips Neuro:  slow to respond, oriented x3, severe generalized weakness.  Unable to lift UE against gravity, moans/ yells with any repositioning  CV: rr, Aflutter on tele, rate 70's, no murmur PULM:  non labored, more tachypneic at times, unable to tolerate lying less than 30 degrees, clear on right, diminished R base, left more diminished, occasional wheeze/ rhonchi, 3 left sided pigtails CT in place > ongoing pus drainage, more drainage from apical and basilar CT, no airleak GI: soft, bs+, NT Extremities: warm/dry, no LE edema, mild generalized UE edema  1/8 left pleural fluid cytology> neg 1/8 L pleural cx> strep intermedius>  1/8 L pleural fungal> neg 1/8 sputum cx> stenotrophomonas maltophilia  2022-10-01 BCX> ngtd  Labs> WBC 18> 23, H/H stable  Total CT output 910 ml/ 24hr CXR> increased opacity on left, CT placement unchanged/ stable   Resolved Hospital Problem list   Shock resolved  Assessment & Plan:   Left empyema s/p placement of 3 pigtail chest tubes 1/8:  Left mainstem endobronchial occlusion Dysphagia  Aspiration pna  Cytology negative. - cont CTX, will need prolonged course of abx therapy - CT chest today to evaluate effusion and placement of 3rd lytic dose - cont aggressive pulm hygiene> IS, flutter, PT/ mobilize and encourage left lung up for postural drainage.   - ongoing SLP> remains on dysphagia 3 diet however patient is reportedly not eating much.   - given no  prior imaging/ films, unclear how long he has had loculated effusion - overall concerned about his poor functional status, question failure to thrive, and concern for ongoing aspiration.   Consider PMT consult for ongoing [place].    Best Practice (right click and "Reselect all SmartList Selections" daily)  Per primary      Kennieth Rad, [facility] 09/26/2022, 10:05 AM  See Amion for pager If no response to pager, please call PCCM consult pager After 7:00 pm call Elink

## 2022-09-26 NOTE — Progress Notes (Signed)
PMT no charge note.  Alerted by RN colleague Ryan Reynolds that the patient continues to decline this evening, high O2 requirements and resp muscle fatigue is evident. Call placed and discussed with wife Ryan Reynolds, updated her as to the patient's current condition. I followed up from my earlier conversation with regards to the patient's condition and re discussed goals of care.  Plan: Continue O2 NCHF + NRB mask tonight, wife is unable to drive in current bad weather and will be at bedside early am on 09-27-22. She wishes to see the patient and discuss with patient's daughter who lives out of town regarding the patient's condition.  It appears that we are moving towards establishing comfort as a singular focus goal and the patient has a high Reynolds of not surviving this hospitalization. This was shared with Ms Ryan Reynolds frankly and compassionately. She wishes to continue current mode of care for now and is well aware of his ongoing decline.  Ok to use PRN Fentanyl for symptom management of pain/dyspnea.  PMT to f/u on 09-27-22.  No charge Ryan Chance MD [facility].

## 2022-09-26 NOTE — Consult Note (Signed)
Consultation Note Date: 09/26/2022   Patient Name: Ryan Reynolds  DOB: 07/17/1936  MRN: [id number]  Age / Sex: 87 y.o., male  PCP: Patient, No Pcp Per Referring Physician: Mendel Corning, MD  Reason for Consultation: Establishing goals of care  HPI/Patient Profile: 87 y.o. male  admitted on 10/01/2022.  14M admitted for pneumonia and empyema of left chest s/p 3 pigtail chest tubes and 2 doses of pleural lytic therapy. Repeat chest CT shows obstructed left lower lobe with debris or possible endobronchial tumor. He has increased moderate sized right pleural effusion 09-26-22.  Clinical Assessment and Goals of Care: Elderly gentleman who lives at home with his wife, has smoking history, has been having cough and fatigue, PCCM following closely, remains admitted to hospital medicine.   I met with the patient, his wife was at bedside. I introduced myself and palliative care as follows: Palliative medicine is specialized medical care for people living with serious illness. It focuses on providing relief from the symptoms and stress of a serious illness. The goal is to improve quality of life for both the patient and the family. Goals of care: Broad aims of medical therapy in relation to the patient's values and preferences. Our aim is to provide medical care aimed at enabling patients to achieve the goals that matter most to them, given the circumstances of their particular medical situation and their constraints.    NEXT OF KIN  Lives at home with wife   SUMMARY OF RECOMMENDATIONS   DNR Time limited trial of current interventions  Monitor hospital course and overall disease trajectory of illness, so as to continue goals of care conversations with patient and wife.  Today, at the time of initial consultation, briefly introduced comfort-measures-only approach with the patient's wife should the patient have  continued decline, additionally, to this and, also introduced to her briefly about hospice services.  For now, continue current mode of care, PMT to follow.  Thank you for the consult. Code Status/Advance Care Planning: DNR   Symptom Management:    Palliative Prophylaxis:  Frequent Pain Assessment   Psycho-social/Spiritual:  Desire for further Chaplaincy support:yes Additional Recommendations: Education on Hospice  Prognosis:  Unable to determine  Discharge Planning: To Be Determined      Primary Diagnoses: Present on Admission:  Loculated left pleural effusion  Severe sepsis (HCC)  Hypomagnesemia  Atrial fibrillation (HCC)  Acute respiratory failure with hypoxia (HCC)  Thyroid nodule greater than or equal to 1.5 cm in diameter incidentally noted on imaging study   I have reviewed the medical record, interviewed the patient and family, and examined the patient. The following aspects are pertinent.  History reviewed. No pertinent past medical history. Social History   Socioeconomic History   Marital status: Married    Spouse name: Izora Gala    Number of children: 1   Years of education: 2.5 year undergrad   Highest education level: Not on file  Occupational History   Occupation: Inventory control - [employer]    Comment:  Works 2 days a week  Tobacco Use   Smoking status: Every Day    Packs/day: 1.00    Types: Cigarettes   Smokeless tobacco: Never  Vaping Use   Vaping Use: Never used  Substance and Sexual Activity   Alcohol use: No   Drug use: No   Sexual activity: Not on file  Other Topics Concern   Not on file  Social History Narrative   Lives at home with wife. No steps to front porch of 1 story home. 1 step to back porch.    Social Determinants of Health   Financial Resource Strain: Not on file  Food Insecurity: Not on file  Transportation Needs: Not on file  Physical Activity: Not on file  Stress: Not on file  Social Connections: Not on file    Family History  Problem Relation Age of Onset   Stroke Mother    Stroke Father    Scheduled Meds:  amiodarone  400 mg Oral BID   Chlorhexidine Gluconate Cloth  6 each Topical Daily   midodrine  10 mg Oral TID WC   sodium chloride flush  10 mL Intrapleural Q8H   sodium chloride flush  10 mL Intrapleural Q8H   sodium chloride flush  10 mL Intrapleural Q8H   sodium chloride flush  3 mL Intravenous Q12H   Continuous Infusions:  sodium chloride Stopped (09/26/22 0913)   sodium chloride Stopped (09/23/22 0942)   ampicillin-sulbactam (UNASYN) IV     PRN Meds:.acetaminophen **OR** acetaminophen, fentaNYL (SUBLIMAZE) injection, HYDROcodone-acetaminophen, lip balm, ondansetron **OR** ondansetron (ZOFRAN) IV, mouth rinse, phenol, senna-docusate Medications Prior to Admission:  Prior to Admission medications   Medication Sig Start Date End Date Taking? Authorizing Provider  acetaminophen (TYLENOL) 325 MG tablet Take 650 mg by mouth every 6 (six) hours as needed for mild pain.   Yes [provider]  Multiple Vitamin (MULTIVITAMIN WITH MINERALS) TABS tablet Take 1 tablet by mouth daily.   Yes [provider]   No Known Allergies Review of Systems confused Physical Exam Elderly gentleman Mild distress Increased work of breathing Monitor noted Trace edema Confused  Vital Signs: BP (!) 105/44   Pulse 68   Temp 98.5 F (36.9 C) (Oral)   Resp 18   Ht 6' (1.829 m)   Wt 82 kg   SpO2 93%   BMI 24.52 kg/m  Pain Scale: 0-10   Pain Score: Asleep   SpO2: SpO2: 93 % O2 Device:SpO2: 93 % O2 Flow Rate: .O2 Flow Rate (L/min): 2 L/min  IO: Intake/output summary:  Intake/Output Summary (Last 24 hours) at 09/26/2022 1400 Last data filed at 09/26/2022 1013 Gross per 24 hour  Intake 3263.23 ml  Output 1665 ml  Net 1598.23 ml    LBM: Last BM Date :  (PTA) Baseline Weight: Weight: 82 kg Most recent weight: Weight: 82 kg     Palliative Assessment/Data:   PPS  40%  Time In:  1300 Time Out:  1400 Time Total:  60 min  Greater than 50%  of this time was spent counseling and coordinating care related to the above assessment and plan.  Signed by: Loistine Chance, MD   Please contact Palliative Medicine Team phone at [phone] for questions and concerns.  For individual provider: See Shea Evans

## 2022-09-27 DIAGNOSIS — Z515 Encounter for palliative care: Secondary | ICD-10-CM

## 2022-09-27 DIAGNOSIS — R531 Weakness: Secondary | ICD-10-CM

## 2022-09-27 DIAGNOSIS — J9601 Acute respiratory failure with hypoxia: Secondary | ICD-10-CM | POA: Diagnosis not present

## 2022-09-27 DIAGNOSIS — I480 Paroxysmal atrial fibrillation: Secondary | ICD-10-CM | POA: Diagnosis not present

## 2022-09-27 DIAGNOSIS — Z7189 Other specified counseling: Secondary | ICD-10-CM

## 2022-09-27 DIAGNOSIS — J90 Pleural effusion, not elsewhere classified: Secondary | ICD-10-CM | POA: Diagnosis not present

## 2022-09-27 DIAGNOSIS — A419 Sepsis, unspecified organism: Secondary | ICD-10-CM | POA: Diagnosis not present

## 2022-09-27 LAB — CBC WITH DIFFERENTIAL/PLATELET
Abs Immature Granulocytes: 0.83 10*3/uL — ABNORMAL HIGH (ref 0.00–0.07)
Basophils Absolute: 0.2 10*3/uL — ABNORMAL HIGH (ref 0.0–0.1)
Basophils Relative: 1 %
Eosinophils Absolute: 0 10*3/uL (ref 0.0–0.5)
Eosinophils Relative: 0 %
HCT: 37.2 % — ABNORMAL LOW (ref 39.0–52.0)
Hemoglobin: 11 g/dL — ABNORMAL LOW (ref 13.0–17.0)
Immature Granulocytes: 3 %
Lymphocytes Relative: 3 %
Lymphs Abs: 0.7 10*3/uL (ref 0.7–4.0)
MCH: 28.9 pg (ref 26.0–34.0)
MCHC: 29.6 g/dL — ABNORMAL LOW (ref 30.0–36.0)
MCV: 97.6 fL (ref 80.0–100.0)
Monocytes Absolute: 1 10*3/uL (ref 0.1–1.0)
Monocytes Relative: 4 %
Neutro Abs: 25.5 10*3/uL — ABNORMAL HIGH (ref 1.7–7.7)
Neutrophils Relative %: 89 %
Platelets: 324 10*3/uL (ref 150–400)
RBC: 3.81 MIL/uL — ABNORMAL LOW (ref 4.22–5.81)
RDW: 13.8 % (ref 11.5–15.5)
WBC: 28.2 10*3/uL — ABNORMAL HIGH (ref 4.0–10.5)
nRBC: 0 % (ref 0.0–0.2)

## 2022-09-27 LAB — AEROBIC/ANAEROBIC CULTURE W GRAM STAIN (SURGICAL/DEEP WOUND): Special Requests: NORMAL

## 2022-09-27 MED ORDER — FOOD THICKENER (SIMPLYTHICK): 1.0000 | ORAL | Status: DC | PRN

## 2022-09-27 NOTE — Progress Notes (Signed)
SLP Cancellation Note  Patient Details Name: Ryan Reynolds MRN: [id number] DOB: Oct 29, 1935   Cancelled treatment:       Reason Eval/Treat Not Completed: Other (comment) (per [place] meeting with Palliative, patient transitioning to comfort measures. SLP to s/o at this time.)  Sonia Baller, MA, CCC-SLP Speech Therapy

## 2022-09-27 NOTE — Progress Notes (Signed)
[facility]                                                                              Ryan Reynolds, is a 87 y.o. male, DOB - Jan 15, 1936, BOF:[id number] Admit date - October 02, 2022    Outpatient Primary MD for the patient is Patient, No Pcp Per  LOS - 6  days  Chief Complaint  Patient presents with   Weakness       Brief summary   Patient is a 87 year old male with history of smoking (quit 2 months ago) presented to ED with cough and fatigue.  Patient reported fatigue and lightheadedness that started 2 weeks prior to admission with progressive weakness and productive cough.  CT chest showed large loculated left-sided pleural effusion suspicious for empyema with malignant effusion not excluded. He was started on empiric antibiotics, IR consulted and patient had 3 chest tubes placed with large volume output. Patient will hypotension and new onset atrial flutter, CCM and cardiology were consulted.  Started on amiodarone. DNR status.  Significant Hospital Events:   1/8: Chest tube placed by IR x 3 1/09: CCM consulted, noted to have hypotension with new atrial flutter.  Cardiology consulted, started on amiodarone 1/10: lytics dose 1 though basilar tube 1/11 : pleural lytics via apical tube, dose 2     Assessment & Plan    Principal Problem:   Severe sepsis (HCC) with left-sided empyema status post placement of 3 pigtail chest tubes on 1/8 Left mainstem endobronchial occlusion, aspiration pneumonia -Cytology negative status post chest tubes x3, fibrinolytic treatment, unclear if patient received third dose yesterday -PCCM following, repeat CT chest showed obstructed left lower lobe and degrees of possible endobronchial tumor, increased moderate size right pleural effusion -Increasing work of breathing, overnight was placed on NRB, this morning on HFNC 15 L, hypotensive -Appreciate palliative medicine following, goals of care addressed today with family, recommended  full comfort care however continue chest tubes for now, currently on IV antibiotics -Continue pain management, if uncontrolled symptoms in the next 24 to 48 hours, will need sedation.  Active Problems: New onset atrial flutter with RVR -Rate controlled on amiodarone -2D echo showed EF of 60 to 65%, no regional WMA -Full comfort care  Hypotension -Has been off of Levophed on 1/10, borderline BP on normal saline at 125 cc/ hour -Patient was brought stone midodrine on 1/12 -Now full comfort care   Dysphagia -Ongoing SLP evaluation, dysphagia 3 diet however not reportedly eating much -Poor status, FTT with ongoing risk of aspiration, palliative medicine consulted for GOC     Hypomagnesemia -Resolved   Acute kidney injury -Creatinine 1.38 on admission, likely due to sepsis and #1 -Patient was placed on IV fluid hydration, creatinine now improved  1.9 cm left thyroid nodule incidentally noted on imaging study -Outpatient follow-up  Right arm swelling possibly due to IV infiltration -Doppler ultrasound negative for DVT    Pressure injury of skin Pressure Injury 09/22/22 Hip Anterior;Left Stage 1 -  Intact skin with non-blanchable redness of a localized area usually over a bony prominence. old wound with slight discoloration and dry scabbing (Active)  09/22/22 1730  Location: Hip  Location Orientation: Anterior;Left  Staging: Stage 1 -  Intact skin with non-blanchable redness of a localized area usually over a bony prominence.  Wound Description (Comments): old wound with slight discoloration and dry scabbing  Present on Admission: Yes  Dressing Type None 09/27/22 0800     Pressure Injury 09/24/22 Coccyx Mid Stage 1 -  Intact skin with non-blanchable redness of a localized area usually over a bony prominence. Pink Blanchable (Active)  09/24/22 1326  Location: Coccyx  Location Orientation: Mid  Staging: Stage 1 -  Intact skin with non-blanchable redness of a localized area  usually over a bony prominence.  Wound Description (Comments): Pink Blanchable  Present on Admission:   Dressing Type Foam - Lift dressing to assess site every shift 09/27/22 0800    Code Status: DNR DVT Prophylaxis:  SCDs Start: 09-25-2022 2316 Level of Care: Level of care: ICU Family Communication:  Disposition Plan:      Remains inpatient appropriate:   Procedures:  Chest tube placement x3  Consultants:   Pulmonary critical care Interventional radiology Cardiology  Antimicrobials:   Anti-infectives (From admission, onward)    Start     Dose/Rate Route Frequency Ordered Stop   09/26/22 2200  metroNIDAZOLE (FLAGYL) IVPB 500 mg        500 mg 100 mL/hr over 60 Minutes Intravenous Every 12 hours 09/26/22 1738     09/26/22 2000  sulfamethoxazole-trimethoprim (BACTRIM) 320 mg of trimethoprim in dextrose 5 % 500 mL IVPB        320 mg of trimethoprim 346.7 mL/hr over 90 Minutes Intravenous Every 8 hours 09/26/22 1738     09/26/22 1800  Ampicillin-Sulbactam (UNASYN) 3 g in sodium chloride 0.9 % 100 mL IVPB  Status:  Discontinued        3 g 200 mL/hr over 30 Minutes Intravenous Every 6 hours 09/26/22 1329 09/26/22 1734   09/25/22 1800  cefTRIAXone (ROCEPHIN) 2 g in sodium chloride 0.9 % 100 mL IVPB  Status:  Discontinued        2 g 200 mL/hr over 30 Minutes Intravenous Every 24 hours 09/25/22 1145 09/26/22 1329   09/23/22 1000  metroNIDAZOLE (FLAGYL) IVPB 500 mg  Status:  Discontinued        500 mg 100 mL/hr over 60 Minutes Intravenous Every 12 hours 09/23/22 0915 09/26/22 1329   Sep 25, 2022 1900  cefTRIAXone (ROCEPHIN) 2 g in sodium chloride 0.9 % 100 mL IVPB  Status:  Discontinued        2 g 200 mL/hr over 30 Minutes Intravenous Every 24 hours September 25, 2022 1852 09/25/22 1145   09/25/2022 1900  azithromycin (ZITHROMAX) 500 mg in sodium chloride 0.9 % 250 mL IVPB  Status:  Discontinued        500 mg 250 mL/hr over 60 Minutes Intravenous Every 24 hours Sep 25, 2022 1852 09/25/22 1144           Medications  amiodarone  400 mg Oral BID   Chlorhexidine Gluconate Cloth  6 each Topical Daily   midodrine  10 mg Oral TID WC   sodium chloride flush  10 mL Intrapleural Q8H   sodium chloride flush  10 mL Intrapleural Q8H   sodium chloride flush  10 mL Intrapleural Q8H   sodium chloride flush  3 mL Intravenous Q12H      Subjective:   Minard Millirons was seen and examined today.  Complaining of pain, states very high, increased WOB, was placed on NRB, on HFNC 15 L this a.m.  no fevers, nausea vomiting.  Appears to be uncomfortable, debilitated   Objective:   Vitals:   09/27/22 0900 09/27/22 1000 09/27/22 1100 09/27/22 1200  BP: (!) 82/67 (!) 89/55 91/60 (!) 78/47  Pulse: 78 69 77 65  Resp: (!) 29 15 15 13   Temp:    (!) 97.5 F (36.4 C)  TempSrc:    Axillary  SpO2: 100% 100% 99% 100%  Weight:      Height:        Intake/Output Summary (Last 24 hours) at 09/27/2022 1259 Last data filed at 09/27/2022 1232 Gross per 24 hour  Intake 3188.09 ml  Output 870 ml  Net 2318.09 ml     Wt Readings from Last 3 Encounters:  09/15/2022 82 kg  01/08/18 82.1 kg  07/10/17 80.1 kg   Physical Exam General: Alert and awake, appears uncomfortable, very weak and debilitated Cardiovascular: S1 S2 clear, RRR.  Respiratory: Diminished breath sounds with poor effort effort, chest tubes + Gastrointestinal: Soft, nontender, nondistended, NBS Ext: no pedal edema bilaterally Neuro: no new deficits Psych: Normal affect, frail-appearing   Data Reviewed:  I have personally reviewed following labs    CBC Lab Results  Component Value Date   WBC 28.2 (H) 09/27/2022   RBC 3.81 (L) 09/27/2022   HGB 11.0 (L) 09/27/2022   HCT 37.2 (L) 09/27/2022   MCV 97.6 09/27/2022   MCH 28.9 09/27/2022   PLT 324 09/27/2022   MCHC 29.6 (L) 09/27/2022   RDW 13.8 09/27/2022   LYMPHSABS 0.7 09/27/2022   MONOABS 1.0 09/27/2022   EOSABS 0.0 09/27/2022   BASOSABS 0.2 (H) 09/27/2022     Last  metabolic panel Lab Results  Component Value Date   NA 136 09/25/2022   K 4.4 09/25/2022   CL 111 09/25/2022   CO2 20 (L) 09/25/2022   BUN 34 (H) 09/25/2022   CREATININE 0.88 09/25/2022   GLUCOSE 112 (H) 09/25/2022   GFRNONAA >60 09/25/2022   CALCIUM 8.0 (L) 09/25/2022   PHOS 3.8 09/23/2022   PROT 4.6 (L) 09/25/2022   ALBUMIN 1.6 (L) 09/25/2022   BILITOT 0.4 09/25/2022   ALKPHOS 51 09/25/2022   AST 18 09/25/2022   ALT 22 09/25/2022   ANIONGAP 5 09/25/2022    CBG (last 3)  No results for input(s): "GLUCAP" in the last 72 hours.    Coagulation Profile: Recent Labs  Lab 09/22/22 0900  INR 1.4*     Radiology Studies: I have personally reviewed the imaging studies  VAS [place] UPPER EXTREMITY VENOUS DUPLEX  Result Date: 09/27/2022 UPPER VENOUS STUDY  Patient Name:  CHIVAS NOTZ  Date of Exam:   09/26/2022 Medical Rec #: [id number]         Accession #:    [id number] Date of Birth: 1936-02-02        Patient Gender: M Patient Age:   52 years Exam Location:  [facility] Procedure:      VAS [place] UPPER EXTREMITY VENOUS DUPLEX Referring Phys: Alayjah Boehringer --------------------------------------------------------------------------------  Indications: Edema Risk Factors: None identified. Limitations: Poor ultrasound/tissue interface and patient positioning. Comparison Study: No prior studies. Performing Technologist: Oliver Hum RVT  Examination Guidelines: A complete evaluation includes B-mode imaging, spectral Doppler, color Doppler, and power Doppler as needed of all accessible portions of each vessel. Bilateral testing is considered an integral part of a complete examination. Limited examinations for reoccurring indications may be performed as noted.  Right Findings: +----------+------------+---------+-----------+----------+-------+ RIGHT     CompressiblePhasicitySpontaneousPropertiesSummary +----------+------------+---------+-----------+----------+-------+ IJV  Full       Yes       Yes                      +----------+------------+---------+-----------+----------+-------+ Subclavian    Full       Yes       Yes                      +----------+------------+---------+-----------+----------+-------+ Axillary      Full       Yes       Yes                      +----------+------------+---------+-----------+----------+-------+ Brachial      Full       Yes       Yes                      +----------+------------+---------+-----------+----------+-------+ Radial        Full                                          +----------+------------+---------+-----------+----------+-------+ Ulnar         Full                                          +----------+------------+---------+-----------+----------+-------+ Cephalic      Full                                          +----------+------------+---------+-----------+----------+-------+ Basilic       Full                                          +----------+------------+---------+-----------+----------+-------+  Left Findings: +----------+------------+---------+-----------+----------+-------+ LEFT      CompressiblePhasicitySpontaneousPropertiesSummary +----------+------------+---------+-----------+----------+-------+ Subclavian    Full       Yes       Yes                      +----------+------------+---------+-----------+----------+-------+  Summary:  Right: No evidence of deep vein thrombosis in the upper extremity. No evidence of superficial vein thrombosis in the upper extremity.  Left: No evidence of thrombosis in the subclavian.  *See table(s) above for measurements and observations.  Diagnosing physician: Sherald Hess MD Electronically signed by Sherald Hess MD on 09/27/2022 at 9:45:24 AM.    Final    CT CHEST WO CONTRAST  Result Date: 09/26/2022 CLINICAL DATA:  Large empyema of left chest and status post placement of 3 percutaneous thoracostomy tube on  09/22/2022. EXAM: CT CHEST WITHOUT CONTRAST TECHNIQUE: Multidetector CT imaging of the chest was performed following the standard protocol without IV contrast. RADIATION DOSE REDUCTION: This exam was performed according to the departmental dose-optimization program which includes automated exposure control, adjustment of the mA and/or kV according to patient size and/or use of iterative reconstruction technique. COMPARISON:  10-20-2022 FINDINGS: Cardiovascular: Stable heart size and calcifications of mitral annulus and aortic valve. No pericardial fluid. Stable atherosclerosis of the thoracic aorta with top-normal caliber of the ascending thoracic aorta  measuring 3.9 cm. Mediastinum/Nodes: No enlarged mediastinal or axillary lymph nodes. Thyroid gland, trachea, and esophagus demonstrate no significant findings. Lungs/Pleura: Increased volume of right pleural fluid, approaching moderate volume. Left apical pigtail thoracostomy tube present with no residual apical pleural fluid. Anterior pigtail thoracostomy tube present with no further loculated anterior pleural fluid. Basilar pigtail thoracostomy tube present posteriorly with very little residual left basilar fluid. The left lower lobe and lingula demonstrate essentially complete collapse/atelectasis and underlying high density. Bronchial obstruction present with mucoid material versus obstructive tumor. No pneumothorax. Upper Abdomen: Small amount of ascites in the visualized right upper abdomen. Lower most images demonstrate probable abdominal aortic aneurysm which is incompletely visualized and measures up to potentially 4.2 cm. Musculoskeletal: No bony lesions or fractures identified. IMPRESSION: 1. Increased right pleural effusion, approaching moderate volume. 2. Left apical and anterior pigtail thoracostomy tubes with no residual loculated apical or anterior pleural fluid. Little to no residual basilar pleural fluid at the level of the basilar pigtail chest  tube. 3. Complete collapse/atelectasis of the left lower lobe and lingula with underlying high density lung. Bronchial obstruction present with mucoid material versus obstructive tumor. 4. Small amount of ascites in the visualized right upper abdomen. 5. Stable atherosclerosis of the thoracic aorta with top-normal caliber of the ascending thoracic aorta measuring 3.9 cm. 6. Partially visualized abdominal aortic aneurysm measuring up to potentially 4.2 cm. This is not completely evaluated and the aneurysm may be ultimately larger. Recommend eventual correlation with CT of the abdomen and pelvis. Aortic Atherosclerosis (ICD10-I70.0). Electronically Signed   By: Irish Lack M.D.   On: 09/26/2022 12:32   DG CHEST PORT 1 VIEW  Result Date: 09/26/2022 CLINICAL DATA:  Empyema. EXAM: PORTABLE CHEST 1 VIEW COMPARISON:  Chest radiograph 09/25/2022. FINDINGS: Three left-sided pleural drainage catheters are unchanged in position. Continued pleural thickening on the left with slightly increased hazy opacification of the left lower lung and associated increase in volume loss. Unchanged blunting of the right costophrenic sulcus with associated hazy opacity over the right lower chest, likely pleural effusion. Stable cardiac and mediastinal contours. IMPRESSION: 1. Slightly increased hazy opacity and volume loss in the left lower lung. Three left-sided pleural drainage catheters are unchanged. 2. Otherwise stable radiographic appearance of the chest. Electronically Signed   By: Orvan Falconer M.D   On: 09/26/2022 08:52       Breslyn Abdo M.D. [facility] 09/27/2022, 12:59 PM  Available via Epic secure chat 7am-7pm After 7 pm, please refer to night coverage provider listed on amion.

## 2022-09-27 NOTE — Progress Notes (Signed)
Met with wife and daughter bedside. No particular needs expressed.Informed them there is always a chaplain available if any support is needed.

## 2022-09-27 NOTE — Progress Notes (Signed)
Daily Progress Note   Patient Name: Ryan Reynolds       Date: 09/27/2022 DOB: 07-Jan-1936  Age: 87 y.o. MRN#: [id number] Attending Physician: Mendel Corning, MD Primary Care Physician: Patient, No Pcp Per Admit Date: 2022-10-05  Reason for Consultation/Follow-up: Establishing goals of care  Subjective: Not awake,has increased work of breathing Wife and another family member at bedside.   Length of Stay: 6  Current Medications: Scheduled Meds:   amiodarone  400 mg Oral BID   Chlorhexidine Gluconate Cloth  6 each Topical Daily   midodrine  10 mg Oral TID WC   sodium chloride flush  10 mL Intrapleural Q8H   sodium chloride flush  10 mL Intrapleural Q8H   sodium chloride flush  10 mL Intrapleural Q8H   sodium chloride flush  3 mL Intravenous Q12H    Continuous Infusions:  sodium chloride 100 mL/hr at 09/26/22 1739   sodium chloride Stopped (09/23/22 0942)   metronidazole 500 mg (09/27/22 6761)   sulfamethoxazole-trimethoprim Stopped (09/27/22 0605)    PRN Meds: acetaminophen **OR** acetaminophen, fentaNYL (SUBLIMAZE) injection, food thickener, HYDROcodone-acetaminophen, lip balm, ondansetron **OR** ondansetron (ZOFRAN) IV, mouth rinse, phenol, senna-docusate  Physical Exam         Weak Increased work of breathing Monitor noted Using accessory muscles of respiration Frail-appearing Has chest tubes  Vital Signs: BP (!) 82/67   Pulse 78   Temp 98.1 F (36.7 C) (Axillary)   Resp (!) 29   Ht 6' (1.829 m)   Wt 82 kg   SpO2 100%   BMI 24.52 kg/m  SpO2: SpO2: 100 % O2 Device: O2 Device: High Flow Nasal Cannula O2 Flow Rate: O2 Flow Rate (L/min): 15 L/min  Intake/output summary:  Intake/Output Summary (Last 24 hours) at 09/27/2022 1110 Last data filed at 09/27/2022  0600 Gross per 24 hour  Intake 305.14 ml  Output 120 ml  Net 185.14 ml   LBM: Last BM Date :  (PTA) Baseline Weight: Weight: 82 kg Most recent weight: Weight: 82 kg       Palliative Assessment/Data:      Patient Active Problem List   Diagnosis Date Noted   Pressure injury of skin 09/23/2022   Loculated left pleural effusion Oct 05, 2022   Severe sepsis (HCC) 10/05/22   Hypomagnesemia 05-Oct-2022   Renal insufficiency 10/05/2022  Atrial fibrillation ([place]) 09/20/2022   Acute respiratory failure with hypoxia ([place]) 10/12/2022   Thyroid nodule greater than or equal to 1.5 cm in diameter incidentally noted on imaging study 09/20/2022   Hearing loss in left ear 07/10/2017   Smoker 07/10/2017   Other headache syndrome 06/20/2017    Palliative Care Assessment & Plan   Patient Profile:    Assessment:  61M admitted for pneumonia and empyema of left chest s/p 3 pigtail chest tubes and 2 doses of pleural lytic therapy. Repeat chest CT shows obstructed left lower lobe with debris or possible endobronchial tumor. He has increased moderate sized right pleural effusion 09-26-22.  Increasing oxygen requirements Ongoing functional status and mental status decline  Recommendations/Plan: Goals of care discussions undertaken in a family meeting with the patient's wife who was present at the bedside.  Patient appears to be more debilitated, has increased work of breathing and is using accessory muscles of respiration.  He does not awaken or try to respond or interact or participate in goals of care discussions.  Goals wishes and values attempted to be explored.  Patient's wife states that that she does not want the patient to suffer.  We talked about symptom management, we talked about comfort measures.  Discussed with the patient's wife about appropriate judicious use of opioids for management of symptoms.  Discussed about discontinuing interventions that are no longer off assistance, discontinue  routine blood work.  Continue chest tubes for now.  Daughter is trying to come here from [place].  Do not escalate oxygen.  Briefly discussed with patient's wife about patient requiring palliative sedation in the next 24 to 48 hours if he has uncontrolled symptoms and escalating symptom burden despite current interventions.  All of her questions addressed to the best of my ability.  PMT to follow.  Comfort care for now.  Goals of Care and Additional Recommendations: Limitations on Scope of Treatment: Full Comfort Care  Code Status:    Code Status Orders  (From admission, onward)           Start     Ordered   10/03/2022 2316  Do not attempt resuscitation (DNR)  Continuous       Question Answer Comment  If patient has no pulse and is not breathing Do Not Attempt Resuscitation   If patient has a pulse and/or is breathing: Medical Treatment Goals LIMITED ADDITIONAL INTERVENTIONS: Use medication/IV fluids and cardiac monitoring as indicated; Do not use intubation or mechanical ventilation (DNI), also provide comfort medications.  Transfer to Progressive/Stepdown as indicated, avoid Intensive Care.   Consent: Discussion documented in EHR or advanced directives reviewed      09/24/2022 2318           Code Status History     This patient has a current code status but no historical code status.       Prognosis:  Hours - Days  Discharge Planning: Anticipated Hospital Death  Care plan was discussed with   Thank you for allowing the Palliative Medicine Team to assist in the care of this patient.  High MDM     Greater than 50%  of this time was spent counseling and coordinating care related to the above assessment and plan.  Loistine Chance, MD  Please contact Palliative Medicine Team phone at [phone] for questions and concerns.

## 2022-09-27 NOTE — Progress Notes (Signed)
PCCM Update:  Briefly spoke with patient's wife regarding his lungs and pleural effusions. I discussed with her my concern with his overall failure to thrive. I let her know we could drain the fluid around the right lung but I do not feel it will provide much benefit. I have recommended that they consider transition to comfort care given limited medical options.  Freda Jackson, MD [facility] Office: [phone]   See Amion for personal pager PCCM on call pager [phone] until 7pm. Please call Elink 7p-7a. [phone]

## 2022-09-28 DIAGNOSIS — R531 Weakness: Secondary | ICD-10-CM | POA: Diagnosis not present

## 2022-09-28 DIAGNOSIS — J9601 Acute respiratory failure with hypoxia: Secondary | ICD-10-CM | POA: Diagnosis not present

## 2022-09-28 DIAGNOSIS — A419 Sepsis, unspecified organism: Secondary | ICD-10-CM | POA: Diagnosis not present

## 2022-09-28 DIAGNOSIS — Z515 Encounter for palliative care: Secondary | ICD-10-CM | POA: Diagnosis not present

## 2022-09-28 DIAGNOSIS — J90 Pleural effusion, not elsewhere classified: Secondary | ICD-10-CM | POA: Diagnosis not present

## 2022-09-28 DIAGNOSIS — Z7189 Other specified counseling: Secondary | ICD-10-CM | POA: Diagnosis not present

## 2022-09-28 LAB — CBC WITH DIFFERENTIAL/PLATELET
Abs Immature Granulocytes: 0.6 10*3/uL — ABNORMAL HIGH (ref 0.00–0.07)
Basophils Absolute: 0.1 10*3/uL (ref 0.0–0.1)
Basophils Relative: 0 %
Eosinophils Absolute: 0 10*3/uL (ref 0.0–0.5)
Eosinophils Relative: 0 %
HCT: 36.1 % — ABNORMAL LOW (ref 39.0–52.0)
Hemoglobin: 10.8 g/dL — ABNORMAL LOW (ref 13.0–17.0)
Immature Granulocytes: 2 %
Lymphocytes Relative: 2 %
Lymphs Abs: 0.5 10*3/uL — ABNORMAL LOW (ref 0.7–4.0)
MCH: 28.9 pg (ref 26.0–34.0)
MCHC: 29.9 g/dL — ABNORMAL LOW (ref 30.0–36.0)
MCV: 96.5 fL (ref 80.0–100.0)
Monocytes Absolute: 1 10*3/uL (ref 0.1–1.0)
Monocytes Relative: 4 %
Neutro Abs: 23.5 10*3/uL — ABNORMAL HIGH (ref 1.7–7.7)
Neutrophils Relative %: 92 %
Platelets: 312 10*3/uL (ref 150–400)
RBC: 3.74 MIL/uL — ABNORMAL LOW (ref 4.22–5.81)
RDW: 14.1 % (ref 11.5–15.5)
WBC: 25.8 10*3/uL — ABNORMAL HIGH (ref 4.0–10.5)
nRBC: 0 % (ref 0.0–0.2)

## 2022-09-28 MED ORDER — FENTANYL CITRATE PF 50 MCG/ML IJ SOSY
25.0000 ug | PREFILLED_SYRINGE | Freq: Once | INTRAMUSCULAR | Status: AC
  Administered 2022-09-28: 25 ug via INTRAVENOUS
  Filled 2022-09-28: qty 1

## 2022-09-28 MED ORDER — HYDROMORPHONE BOLUS VIA INFUSION
1.0000 mg | INTRAVENOUS | Status: DC | PRN
  Administered 2022-09-28: 1 mg via INTRAVENOUS

## 2022-09-28 MED ORDER — LORAZEPAM 2 MG/ML PO CONC
0.5000 mg | Freq: Four times a day (QID) | ORAL | Status: DC
  Filled 2022-09-28: qty 1

## 2022-09-28 MED ORDER — GLYCOPYRROLATE 0.2 MG/ML IJ SOLN: 0.2000 mg | Freq: Four times a day (QID) | INTRAMUSCULAR | Status: DC | PRN

## 2022-09-28 MED ORDER — LORAZEPAM 2 MG/ML IJ SOLN
0.5000 mg | Freq: Four times a day (QID) | INTRAMUSCULAR | Status: DC
  Administered 2022-09-28 (×2): 0.5 mg via INTRAVENOUS
  Filled 2022-09-28 (×2): qty 1

## 2022-09-28 MED ORDER — SODIUM CHLORIDE 0.9 % IV SOLN
0.5000 mg/h | INTRAVENOUS | Status: DC
  Administered 2022-09-28: 0.5 mg/h via INTRAVENOUS
  Filled 2022-09-28: qty 5

## 2022-09-28 MED ORDER — MORPHINE SULFATE (PF) 2 MG/ML IV SOLN
2.0000 mg | Freq: Once | INTRAVENOUS | Status: DC
  Administered 2022-09-28: 2 mg via INTRAVENOUS
  Filled 2022-09-28: qty 1

## 2022-10-14 ENCOUNTER — Ambulatory Visit: Payer: Medicare Other | Admitting: Internal Medicine

## 2022-10-16 NOTE — Progress Notes (Signed)
    OVERNIGHT PROGRESS REPORT  Notified by RN that patient has expired at 1940 hrs.  Patient was DNR/CMO (comfort care) followed by Palliative  2 RN verified.  Family was immediately available to RN at bedside.     Gershon Cull MSNA ACNPC-AG Acute Care Nurse Practitioner [place]

## 2022-10-16 NOTE — Progress Notes (Signed)
12.5 mcg of fentanyl was given to pt PRN twice during pm shift. Became busy throughout shift and forgot to document waste. A total of 25 mcg was given throughout pm shift. A total of 75 mcg was wasted.

## 2022-10-16 NOTE — Progress Notes (Signed)
Called to provide support to family after death of patient. Provided presence and support as well as answering spiritual concerns of our shared [place]. Prayed with family. Provided " patient placement" information because arrangements had not been made.

## 2022-10-16 NOTE — Progress Notes (Signed)
[facility]                                                                              Ryan Reynolds, is a 87 y.o. male, DOB - 01/11/1936, GQQ:[id number] Admit date - October 20, 2022    Outpatient Primary MD for the patient is Patient, No Pcp Per  LOS - 7  days  Chief Complaint  Patient presents with   Weakness       Brief summary   Patient is a 87 year old male with history of smoking (quit 2 months ago) presented to ED with cough and fatigue.  Patient reported fatigue and lightheadedness that started 2 weeks prior to admission with progressive weakness and productive cough.  CT chest showed large loculated left-sided pleural effusion suspicious for empyema with malignant effusion not excluded. He was started on empiric antibiotics, IR consulted and patient had 3 chest tubes placed with large volume output. Patient will hypotension and new onset atrial flutter, CCM and cardiology were consulted.  Started on amiodarone. DNR status.  Significant Hospital Events:   1/8: Chest tube placed by IR x 3 1/09: CCM consulted, noted to have hypotension with new atrial flutter.  Cardiology consulted, started on amiodarone 1/10: lytics dose 1 though basilar tube 1/11 : pleural lytics via apical tube, dose 2 1/14: Goals of care addressed with family, full comfort care     Assessment & Plan    Principal Problem:   Severe sepsis (HCC) with left-sided empyema status post placement of 3 pigtail chest tubes on 1/8 Left mainstem endobronchial occlusion, aspiration pneumonia -Cytology negative status post chest tubes x3, fibrinolytic treatment, unclear if patient received third dose yesterday -PCCM following, repeat CT chest showed obstructed left lower lobe and degrees of possible endobronchial tumor, increased moderate size right pleural effusion -Palliative medicine following, addressed GOC today with family, full comfort care per family's wishes -Continue chest tube  x3, now  that he is full comfort care, in significant pain, will await pulmonology recommendation regarding the chest tubes if they can be removed  Active Problems: New onset atrial flutter with RVR -Rate controlled on amiodarone -2D echo showed EF of 60 to 65%, no regional WMA -Continue full comfort care  Hypotension -Has been off of Levophed on 1/10, borderline BP on normal saline at 125 cc/ hour -Patient was brought stone midodrine on 1/12 -Now full comfort care   Dysphagia -Ongoing SLP evaluation, dysphagia 3 diet however not reportedly eating much -Poor status, FTT with ongoing risk of aspiration, palliative medicine consulted for GOC     Hypomagnesemia -Resolved   Acute kidney injury -Creatinine 1.38 on admission, likely due to sepsis and #1 -Patient was placed on IV fluid hydration, creatinine now improved -Comfort care  1.9 cm left thyroid nodule incidentally noted on imaging study -Comfort care  Right arm swelling possibly due to IV infiltration -Doppler ultrasound negative for DVT    Pressure injury of skin Pressure Injury 09/22/22 Hip Anterior;Left Stage 1 -  Intact skin with non-blanchable redness of a localized area usually over a bony prominence. old wound with slight discoloration and dry scabbing (Active)  09/22/22 1730  Location: Hip  Location Orientation: Anterior;Left  Staging: Stage 1 -  Intact skin with non-blanchable redness of a localized area usually over a bony prominence.  Wound Description (Comments): old wound with slight discoloration and dry scabbing  Present on Admission: Yes  Dressing Type None 09/29/2022 0800     Pressure Injury 09/24/22 Coccyx Mid Stage 1 -  Intact skin with non-blanchable redness of a localized area usually over a bony prominence. Pink Blanchable (Active)  09/24/22 1326  Location: Coccyx  Location Orientation: Mid  Staging: Stage 1 -  Intact skin with non-blanchable redness of a localized area usually over a bony prominence.   Wound Description (Comments): Pink Blanchable  Present on Admission:   Dressing Type Foam - Lift dressing to assess site every shift 29-Sep-2022 0800    Code Status: DNR DVT Prophylaxis:  SCDs Start: 09/27/2022 2316 Level of Care: Level of care: ICU Family Communication:  Disposition Plan:      Remains inpatient appropriate:   Procedures:  Chest tube placement x3  Consultants:   Pulmonary critical care Interventional radiology Cardiology  Antimicrobials:   Anti-infectives (From admission, onward)    Start     Dose/Rate Route Frequency Ordered Stop   09/26/22 2200  metroNIDAZOLE (FLAGYL) IVPB 500 mg  Status:  Discontinued        500 mg 100 mL/hr over 60 Minutes Intravenous Every 12 hours 09/26/22 1738 09/29/2022 1126   09/26/22 2000  sulfamethoxazole-trimethoprim (BACTRIM) 320 mg of trimethoprim in dextrose 5 % 500 mL IVPB  Status:  Discontinued        320 mg of trimethoprim 346.7 mL/hr over 90 Minutes Intravenous Every 8 hours 09/26/22 1738 2022-09-29 1126   09/26/22 1800  Ampicillin-Sulbactam (UNASYN) 3 g in sodium chloride 0.9 % 100 mL IVPB  Status:  Discontinued        3 g 200 mL/hr over 30 Minutes Intravenous Every 6 hours 09/26/22 1329 09/26/22 1734   09/25/22 1800  cefTRIAXone (ROCEPHIN) 2 g in sodium chloride 0.9 % 100 mL IVPB  Status:  Discontinued        2 g 200 mL/hr over 30 Minutes Intravenous Every 24 hours 09/25/22 1145 09/26/22 1329   09/23/22 1000  metroNIDAZOLE (FLAGYL) IVPB 500 mg  Status:  Discontinued        500 mg 100 mL/hr over 60 Minutes Intravenous Every 12 hours 09/23/22 0915 09/26/22 1329   09/27/2022 1900  cefTRIAXone (ROCEPHIN) 2 g in sodium chloride 0.9 % 100 mL IVPB  Status:  Discontinued        2 g 200 mL/hr over 30 Minutes Intravenous Every 24 hours 10/13/2022 1852 09/25/22 1145   09/26/2022 1900  azithromycin (ZITHROMAX) 500 mg in sodium chloride 0.9 % 250 mL IVPB  Status:  Discontinued        500 mg 250 mL/hr over 60 Minutes Intravenous Every 24 hours  09/27/2022 1852 09/25/22 1144          Medications  amiodarone  400 mg Oral BID   Chlorhexidine Gluconate Cloth  6 each Topical Daily   LORazepam  0.5 mg Intravenous Q6H   midodrine  10 mg Oral TID WC   sodium chloride flush  10 mL Intrapleural Q8H      Subjective:   Ryan Reynolds was seen and examined today.  Complaining of significant pain, 10 out of 10 this morning.  On 15 L O2 HFNC.  No fevers or chills, no acute issues overnight except the pain.    Objective:   Vitals:  2022/10/27 0500 10-27-2022 0600 2022-10-27 0700 10-27-2022 0800  BP: (!) 76/48 (!) 97/51 (!) 106/59 (!) 108/56  Pulse: 67 66 62 63  Resp: (!) 22 14 (!) 26 (!) 33  Temp:      TempSrc:      SpO2: 100% 95% 95% 94%  Weight:      Height:        Intake/Output Summary (Last 24 hours) at 10-27-22 1220 Last data filed at 10/27/22 0916 Gross per 24 hour  Intake 3063.86 ml  Output 256 ml  Net 2807.86 ml     Wt Readings from Last 3 Encounters:  10/10/2022 82 kg  01/08/18 82.1 kg  07/10/17 80.1 kg    Physical Exam General: Alert and oriented x 3, very uncomfortable with pain, ill-appearing, frail Cardiovascular: S1 S2 clear, RRR.  Respiratory: Diminished breath sounds with poor effort, chest tubes x3 Gastrointestinal: Soft, nontender, nondistended, NBS Ext: no pedal edema bilaterally Neuro: no new deficits Psych: anxious and in pain     Data Reviewed:  I have personally reviewed following labs    CBC Lab Results  Component Value Date   WBC 25.8 (H) 10-27-2022   RBC 3.74 (L) 2022/10/27   HGB 10.8 (L) October 27, 2022   HCT 36.1 (L) 10-27-2022   MCV 96.5 10/27/22   MCH 28.9 10/27/2022   PLT 312 2022/10/27   MCHC 29.9 (L) 10-27-2022   RDW 14.1 10/27/2022   LYMPHSABS 0.5 (L) 2022/10/27   MONOABS 1.0 2022/10/27   EOSABS 0.0 2022-10-27   BASOSABS 0.1 16/06/9603     Last metabolic panel Lab Results  Component Value Date   NA 136 09/25/2022   K 4.4 09/25/2022   CL 111 09/25/2022   CO2 20  (L) 09/25/2022   BUN 34 (H) 09/25/2022   CREATININE 0.88 09/25/2022   GLUCOSE 112 (H) 09/25/2022   GFRNONAA >60 09/25/2022   CALCIUM 8.0 (L) 09/25/2022   PHOS 3.8 09/23/2022   PROT 4.6 (L) 09/25/2022   ALBUMIN 1.6 (L) 09/25/2022   BILITOT 0.4 09/25/2022   ALKPHOS 51 09/25/2022   AST 18 09/25/2022   ALT 22 09/25/2022   ANIONGAP 5 09/25/2022    CBG (last 3)  No results for input(s): "GLUCAP" in the last 72 hours.    Coagulation Profile: Recent Labs  Lab 09/22/22 0900  INR 1.4*     Radiology Studies: I have personally reviewed the imaging studies  VAS [place] UPPER EXTREMITY VENOUS DUPLEX  Result Date: 09/27/2022 UPPER VENOUS STUDY  Patient Name:  JLON BETKER  Date of Exam:   09/26/2022 Medical Rec #: [id number]         Accession #:    [id number] Date of Birth: 03-17-1936        Patient Gender: M Patient Age:   21 years Exam Location:  [facility] Procedure:      VAS [place] UPPER EXTREMITY VENOUS DUPLEX Referring Phys: Evalynn Hankins --------------------------------------------------------------------------------  Indications: Edema Risk Factors: None identified. Limitations: Poor ultrasound/tissue interface and patient positioning. Comparison Study: No prior studies. Performing Technologist: Oliver Hum RVT  Examination Guidelines: A complete evaluation includes B-mode imaging, spectral Doppler, color Doppler, and power Doppler as needed of all accessible portions of each vessel. Bilateral testing is considered an integral part of a complete examination. Limited examinations for reoccurring indications may be performed as noted.  Right Findings: +----------+------------+---------+-----------+----------+-------+ RIGHT     CompressiblePhasicitySpontaneousPropertiesSummary +----------+------------+---------+-----------+----------+-------+ IJV           Full  Yes       Yes                      +----------+------------+---------+-----------+----------+-------+  Subclavian    Full       Yes       Yes                      +----------+------------+---------+-----------+----------+-------+ Axillary      Full       Yes       Yes                      +----------+------------+---------+-----------+----------+-------+ Brachial      Full       Yes       Yes                      +----------+------------+---------+-----------+----------+-------+ Radial        Full                                          +----------+------------+---------+-----------+----------+-------+ Ulnar         Full                                          +----------+------------+---------+-----------+----------+-------+ Cephalic      Full                                          +----------+------------+---------+-----------+----------+-------+ Basilic       Full                                          +----------+------------+---------+-----------+----------+-------+  Left Findings: +----------+------------+---------+-----------+----------+-------+ LEFT      CompressiblePhasicitySpontaneousPropertiesSummary +----------+------------+---------+-----------+----------+-------+ Subclavian    Full       Yes       Yes                      +----------+------------+---------+-----------+----------+-------+  Summary:  Right: No evidence of deep vein thrombosis in the upper extremity. No evidence of superficial vein thrombosis in the upper extremity.  Left: No evidence of thrombosis in the subclavian.  *See table(s) above for measurements and observations.  Diagnosing physician: Monica Martinez MD Electronically signed by Monica Martinez MD on 09/27/2022 at 9:45:24 AM.    Final        Estill Cotta M.D. [facility] October 26, 2022, 12:20 PM  Available via Epic secure chat 7am-7pm After 7 pm, please refer to night coverage provider listed on amion.

## 2022-10-16 NOTE — Progress Notes (Signed)
Daily Progress Note   Patient Name: Ryan Reynolds       Date: 09/29/2022 DOB: 09-30-1935  Age: 87 y.o. MRN#: [id number] Attending Physician: Mendel Corning, MD Primary Care Physician: Patient, No Pcp Per Admit Date: 10-05-2022  Reason for Consultation/Follow-up: Establishing goals of care  Subjective: Restless and awake,has increased work of breathing Wife and daughter at bedside.   Length of Stay: 7  Current Medications: Scheduled Meds:   amiodarone  400 mg Oral BID   Chlorhexidine Gluconate Cloth  6 each Topical Daily   midodrine  10 mg Oral TID WC    morphine injection  2 mg Intravenous Once   sodium chloride flush  10 mL Intrapleural Q8H    Continuous Infusions:  sodium chloride 100 mL/hr at 10/08/2022 0723   sodium chloride Stopped (09/23/22 0942)   metronidazole Stopped (09/27/22 2233)   sulfamethoxazole-trimethoprim Stopped (10/02/2022 9833)    PRN Meds: acetaminophen **OR** acetaminophen, fentaNYL (SUBLIMAZE) injection, food thickener, HYDROcodone-acetaminophen, lip balm, ondansetron **OR** ondansetron (ZOFRAN) IV, mouth rinse, phenol, senna-docusate  Physical Exam         Weak Increased work of breathing Monitor noted Using accessory muscles of respiration Frail-appearing Has chest tubes  Vital Signs: BP (!) 108/56 (BP Location: Right Leg)   Pulse 63   Temp 97.9 F (36.6 C)   Resp (!) 33   Ht 6' (1.829 m)   Wt 82 kg   SpO2 94%   BMI 24.52 kg/m  SpO2: SpO2: 94 % O2 Device: O2 Device: High Flow Nasal Cannula O2 Flow Rate: O2 Flow Rate (L/min): 15 L/min  Intake/output summary:  Intake/Output Summary (Last 24 hours) at 10/14/2022 0937 Last data filed at 09/16/2022 0916 Gross per 24 hour  Intake 5604.42 ml  Output 1006 ml  Net 4598.42 ml    LBM: Last  BM Date :  (PTA) Baseline Weight: Weight: 82 kg Most recent weight: Weight: 82 kg       Palliative Assessment/Data:      Patient Active Problem List   Diagnosis Date Noted   Pressure injury of skin 09/23/2022   Loculated left pleural effusion 10-05-22   Severe sepsis (HCC) 2022/10/05   Hypomagnesemia Oct 05, 2022   Renal insufficiency 2022-10-05   Atrial fibrillation ([place]) 10-05-22   Acute respiratory failure with hypoxia (HCC) 10-05-22   Thyroid  nodule greater than or equal to 1.5 cm in diameter incidentally noted on imaging study 2022/09/30   Hearing loss in left ear 07/10/2017   Smoker 07/10/2017   Other headache syndrome 06/20/2017    Palliative Care Assessment & Plan   Patient Profile:    Assessment:  70M admitted for pneumonia and empyema of left chest s/p 3 pigtail chest tubes and 2 doses of pleural lytic therapy. Repeat chest CT shows obstructed left lower lobe with debris or possible endobronchial tumor. He has increased moderate sized right pleural effusion 09-26-22.  Increasing oxygen requirements Ongoing functional status and mental status decline  Recommendations/Plan: Goals of care discussions undertaken in a family meeting with the patient's wife and daughter who came from out of town.  Discussed about comfort care, aggressive symptom management and concept of "proportionate palliative sedation". The patient is in respiratory distress, has increased work of breathing, has generalized pain.  Principle of double effect explained.  Anticipate hospital death.  Start low dose hydromorphone infusion, also with provision for boluses for breakthrough pain/dyspnea.  Low dose scheduled Ativan, along with the opioids as the patient has anxiety component with his dyspnea.  Ok to de escalate O2 [place] once comfort medications have been started, discussed with family. All of their questions addressed to the best of my ability.     Goals of Care and Additional  Recommendations: Limitations on Scope of Treatment: Full Comfort Care  Code Status:    Code Status Orders  (From admission, onward)           Start     Ordered   09/30/2022 2316  Do not attempt resuscitation (DNR)  Continuous       Question Answer Comment  If patient has no pulse and is not breathing Do Not Attempt Resuscitation   If patient has a pulse and/or is breathing: Medical Treatment Goals LIMITED ADDITIONAL INTERVENTIONS: Use medication/IV fluids and cardiac monitoring as indicated; Do not use intubation or mechanical ventilation (DNI), also provide comfort medications.  Transfer to Progressive/Stepdown as indicated, avoid Intensive Care.   Consent: Discussion documented in EHR or advanced directives reviewed      Sep 30, 2022 2318           Code Status History     This patient has a current code status but no historical code status.       Prognosis:  Hours - Days  Discharge Planning: Anticipated Hospital Death  Care plan was discussed with wife daughter and nursing colleagues.   Thank you for allowing the Palliative Medicine Team to assist in the care of this patient.  High MDM     Greater than 50%  of this time was spent counseling and coordinating care related to the above assessment and plan.  Loistine Chance, MD  Please contact Palliative Medicine Team phone at [phone] for questions and concerns.

## 2022-10-16 NOTE — Death Summary Note (Signed)
DEATH SUMMARY   Patient Details  Name: Ryan Reynolds MRN: [id number] DOB: 1936/02/08 CBU:LAGTXMI, No Pcp Per Admission/Discharge Information   Admit Date:  09-22-22  Date of Death: Date of Death: 09/29/2022  Time of Death: Time of Death: 1940  Length of Stay: 7   Principle Cause of death: severe sepsis   Hospital Diagnoses: Principal Problem:   Severe sepsis ([place]) Active Problems:   Loculated left pleural effusion   Acute respiratory failure with hypoxia (HCC)   Atrial fibrillation (HCC)   Hypomagnesemia   Renal insufficiency   Thyroid nodule greater than or equal to 1.5 cm in diameter incidentally noted on imaging study   Pressure injury of skin   Hospital Course: No notes on file  Assessment and Plan: No notes have been filed under this hospital service. Service: Hospitalist     {Tip this will not be part of the note when signed Body mass index is 24.52 kg/m. , ,  Active Pressure Injury/Wound(s)     Pressure Ulcer  Duration          Pressure Injury 09/22/22 Hip Anterior;Left Stage 1 -  Intact skin with non-blanchable redness of a localized area usually over a bony prominence. old wound with slight discoloration and dry scabbing 6 days   Pressure Injury 09/24/22 Coccyx Mid Stage 1 -  Intact skin with non-blanchable redness of a localized area usually over a bony prominence. Pink Blanchable 4 days           (Optional):[id number]}   Procedures: ***  Consultations: ***  The results of significant diagnostics from this hospitalization (including imaging, microbiology, ancillary and laboratory) are listed below for reference.   Significant Diagnostic Studies: VAS [place] UPPER EXTREMITY VENOUS DUPLEX  Result Date: 09/27/2022 UPPER VENOUS STUDY  Patient Name:  JAMILE SIVILS  Date of Exam:   09/26/2022 Medical Rec #: [id number]         Accession #:    [id number] Date of Birth: [birth year]        Patient Gender: M Patient Age:   73 years Exam Location:  [facility] Procedure:      VAS [place] UPPER EXTREMITY VENOUS DUPLEX Referring Phys: Makya Phillis --------------------------------------------------------------------------------  Indications: Edema Risk Factors: None identified. Limitations: Poor ultrasound/tissue interface and patient positioning. Comparison Study: No prior studies. Performing Technologist: Oliver Hum RVT  Examination Guidelines: A complete evaluation includes B-mode imaging, spectral Doppler, color Doppler, and power Doppler as needed of all accessible portions of each vessel. Bilateral testing is considered an integral part of a complete examination. Limited examinations for reoccurring indications may be performed as noted.  Right Findings: +----------+------------+---------+-----------+----------+-------+ RIGHT     CompressiblePhasicitySpontaneousPropertiesSummary +----------+------------+---------+-----------+----------+-------+ IJV           Full       Yes       Yes                      +----------+------------+---------+-----------+----------+-------+ Subclavian    Full       Yes       Yes                      +----------+------------+---------+-----------+----------+-------+ Axillary      Full       Yes       Yes                      +----------+------------+---------+-----------+----------+-------+ Brachial  Full       Yes       Yes                      +----------+------------+---------+-----------+----------+-------+ Radial        Full                                          +----------+------------+---------+-----------+----------+-------+ Ulnar         Full                                          +----------+------------+---------+-----------+----------+-------+ Cephalic      Full                                          +----------+------------+---------+-----------+----------+-------+ Basilic       Full                                           +----------+------------+---------+-----------+----------+-------+  Left Findings: +----------+------------+---------+-----------+----------+-------+ LEFT      CompressiblePhasicitySpontaneousPropertiesSummary +----------+------------+---------+-----------+----------+-------+ Subclavian    Full       Yes       Yes                      +----------+------------+---------+-----------+----------+-------+  Summary:  Right: No evidence of deep vein thrombosis in the upper extremity. No evidence of superficial vein thrombosis in the upper extremity.  Left: No evidence of thrombosis in the subclavian.  *See table(s) above for measurements and observations.  Diagnosing physician: Sherald Hess MD Electronically signed by Sherald Hess MD on 09/27/2022 at 9:45:24 AM.    Final    CT CHEST WO CONTRAST  Result Date: 09/26/2022 CLINICAL DATA:  Large empyema of left chest and status post placement of 3 percutaneous thoracostomy tube on 09/22/2022. EXAM: CT CHEST WITHOUT CONTRAST TECHNIQUE: Multidetector CT imaging of the chest was performed following the standard protocol without IV contrast. RADIATION DOSE REDUCTION: This exam was performed according to the departmental dose-optimization program which includes automated exposure control, adjustment of the mA and/or kV according to patient size and/or use of iterative reconstruction technique. COMPARISON:  10/07/2022 FINDINGS: Cardiovascular: Stable heart size and calcifications of mitral annulus and aortic valve. No pericardial fluid. Stable atherosclerosis of the thoracic aorta with top-normal caliber of the ascending thoracic aorta measuring 3.9 cm. Mediastinum/Nodes: No enlarged mediastinal or axillary lymph nodes. Thyroid gland, trachea, and esophagus demonstrate no significant findings. Lungs/Pleura: Increased volume of right pleural fluid, approaching moderate volume. Left apical pigtail thoracostomy tube present with no residual apical pleural  fluid. Anterior pigtail thoracostomy tube present with no further loculated anterior pleural fluid. Basilar pigtail thoracostomy tube present posteriorly with very little residual left basilar fluid. The left lower lobe and lingula demonstrate essentially complete collapse/atelectasis and underlying high density. Bronchial obstruction present with mucoid material versus obstructive tumor. No pneumothorax. Upper Abdomen: Small amount of ascites in the visualized right upper abdomen. Lower most images demonstrate probable abdominal aortic aneurysm which is incompletely visualized and measures up to potentially 4.2 cm. Musculoskeletal: No bony  lesions or fractures identified. IMPRESSION: 1. Increased right pleural effusion, approaching moderate volume. 2. Left apical and anterior pigtail thoracostomy tubes with no residual loculated apical or anterior pleural fluid. Little to no residual basilar pleural fluid at the level of the basilar pigtail chest tube. 3. Complete collapse/atelectasis of the left lower lobe and lingula with underlying high density lung. Bronchial obstruction present with mucoid material versus obstructive tumor. 4. Small amount of ascites in the visualized right upper abdomen. 5. Stable atherosclerosis of the thoracic aorta with top-normal caliber of the ascending thoracic aorta measuring 3.9 cm. 6. Partially visualized abdominal aortic aneurysm measuring up to potentially 4.2 cm. This is not completely evaluated and the aneurysm may be ultimately larger. Recommend eventual correlation with CT of the abdomen and pelvis. Aortic Atherosclerosis (ICD10-I70.0). Electronically Signed   By: Irish Lack M.D.   On: 09/26/2022 12:32   DG CHEST PORT 1 VIEW  Result Date: 09/26/2022 CLINICAL DATA:  Empyema. EXAM: PORTABLE CHEST 1 VIEW COMPARISON:  Chest radiograph 09/25/2022. FINDINGS: Three left-sided pleural drainage catheters are unchanged in position. Continued pleural thickening on the left with  slightly increased hazy opacification of the left lower lung and associated increase in volume loss. Unchanged blunting of the right costophrenic sulcus with associated hazy opacity over the right lower chest, likely pleural effusion. Stable cardiac and mediastinal contours. IMPRESSION: 1. Slightly increased hazy opacity and volume loss in the left lower lung. Three left-sided pleural drainage catheters are unchanged. 2. Otherwise stable radiographic appearance of the chest. Electronically Signed   By: Orvan Falconer M.D   On: 09/26/2022 08:52   DG CHEST PORT 1 VIEW  Result Date: 09/25/2022 CLINICAL DATA:  Empyema EXAM: PORTABLE CHEST 1 VIEW COMPARISON:  09/24/2022 FINDINGS: Three left pleural drainage catheters are unchanged in position. Continued pleural thickening on the left with hazy opacification of the left hemithorax, and scattered airspace opacity in the left lung most concentrated in the left lower lobe. There is also some volume loss in the left hemithorax. Mild blunting of the right lateral costophrenic angle with hazy density along the right lower chest unchanged from prior. Borderline enlargement of the cardiopericardial silhouette. Mildly indistinct pulmonary vasculature. Overall the appearance of the chest is not substantially changed from yesterday's exam. IMPRESSION: 1. No significant change from yesterday's exam. Three left pleural drainage catheters are unchanged in position. 2. Continued pleural thickening on the left with hazy opacification of the left hemithorax and scattered airspace opacity in the left lung. 3. Borderline enlargement of the cardiopericardial silhouette with mildly indistinct pulmonary vasculature. Electronically Signed   By: Gaylyn Rong M.D.   On: 09/25/2022 08:09   DG CHEST PORT 1 VIEW  Result Date: 09/24/2022 CLINICAL DATA:  [id number] with empyema, chest tubes. EXAM: PORTABLE CHEST 1 VIEW COMPARISON:  Portable chest yesterday at 7:54 a.m., chest CT 10/09/2022  without contrast. FINDINGS: 4:12 a.m. There are 3 left pigtail chest tube stable in positioning. No visible pneumothorax. There is increased opacity in the left lower chest which could be due to increasing consolidation or reaccumulating pleural fluid. There is increasing haziness along side the chest tube in the lateral left upper to mid lung field which could also be due to airspace disease or accumulating fluid. On the right, there is increased small to moderate pleural effusion and increased patchy atelectasis or consolidation in the right lower lung field. The cardiac size is stable. There is mild central vascular fullness without overt edema. Right upper lung field remains clear. Mild  S shaped thoracic scoliosis. IMPRESSION: 1. Increased opacity in the left lower chest which could be due to increasing consolidation or reaccumulating pleural fluid. 2. Increased haziness along side the chest tube in the lateral left upper to mid lung field which could also be due to airspace disease or reaccumulating fluid. 3. Increased small to moderate right pleural effusion and increased patchy atelectasis or consolidation in the right lower lung field. Electronically Signed   By: Almira Bar M.D.   On: 09/24/2022 06:38   ECHOCARDIOGRAM COMPLETE  Result Date: 09/23/2022    ECHOCARDIOGRAM REPORT   Patient Name:   ANDREN BETHEA Date of Exam: 09/23/2022 Medical Rec #:  [id number]        Height:       72.0 in Accession #:    [id number]       Weight:       180.8 lb Date of Birth:  06-04-1936       BSA:          2.041 m Patient Age:    86 years         BP:           98/57 mmHg Patient Gender: M                HR:           64 bpm. Exam Location:  Inpatient Procedure: 2D Echo, Cardiac Doppler and Color Doppler Indications:    R94.31 Abnormal EKG  History:        Patient has no prior history of Echocardiogram examinations.                 Arrythmias:Atrial Fibrillation.  Sonographer:    Eulah Pont RDCS Referring Phys:  [id number] [facility]  Sonographer Comments: Image acquisition challenging due to patient body habitus. IMPRESSIONS  1. Left ventricular ejection fraction, by estimation, is 60 to 65%. The left ventricle has normal function. The left ventricle has no regional wall motion abnormalities. Left ventricular diastolic function could not be evaluated.  2. Right ventricular systolic function is normal. The right ventricular size is mildly enlarged. There is mildly elevated pulmonary artery systolic pressure. The estimated right ventricular systolic pressure is 41.6 mmHg.  3. Right atrial size was mildly dilated.  4. The mitral valve is degenerative. Mild mitral valve regurgitation. No evidence of mitral stenosis. Moderate mitral annular calcification.  5. The aortic valve is tricuspid. There is moderate calcification of the aortic valve. There is moderate thickening of the aortic valve. Aortic valve regurgitation is trivial. Mild aortic valve stenosis. Aortic valve area, by VTI measures 2.51 cm. Aortic valve mean gradient measures 10.0 mmHg. Aortic valve Vmax measures 2.23 m/s.  6. There is mild dilatation of the ascending aorta, measuring 41 mm.  7. The inferior vena cava is dilated in size with <50% respiratory variability, suggesting right atrial pressure of 15 mmHg. FINDINGS  Left Ventricle: Left ventricular ejection fraction, by estimation, is 60 to 65%. The left ventricle has normal function. The left ventricle has no regional wall motion abnormalities. The left ventricular internal cavity size was normal in size. There is  no left ventricular hypertrophy. Left ventricular diastolic function could not be evaluated due to atrial fibrillation. Left ventricular diastolic function could not be evaluated. Right Ventricle: The right ventricular size is mildly enlarged. No increase in right ventricular wall thickness. Right ventricular systolic function is normal. There is mildly elevated pulmonary artery systolic pressure.  The tricuspid regurgitant velocity is  2.58 m/s, and with an assumed right atrial pressure of 15 mmHg, the estimated right ventricular systolic pressure is 41.6 mmHg. Left Atrium: Left atrial size was normal in size. Right Atrium: Right atrial size was mildly dilated. Pericardium: Trivial pericardial effusion is present. Mitral Valve: The mitral valve is degenerative in appearance. Moderate mitral annular calcification. Mild mitral valve regurgitation. No evidence of mitral valve stenosis. Tricuspid Valve: The tricuspid valve is grossly normal. Tricuspid valve regurgitation is mild . No evidence of tricuspid stenosis. Aortic Valve: The aortic valve is tricuspid. There is moderate calcification of the aortic valve. There is moderate thickening of the aortic valve. Aortic valve regurgitation is trivial. Mild aortic stenosis is present. Aortic valve mean gradient measures 10.0 mmHg. Aortic valve peak gradient measures 19.9 mmHg. Aortic valve area, by VTI measures 2.51 cm. Pulmonic Valve: The pulmonic valve was grossly normal. Pulmonic valve regurgitation is trivial. No evidence of pulmonic stenosis. Aorta: The aortic root is normal in size and structure. There is mild dilatation of the ascending aorta, measuring 41 mm. Venous: The inferior vena cava is dilated in size with less than 50% respiratory variability, suggesting right atrial pressure of 15 mmHg. IAS/Shunts: The atrial septum is grossly normal.  LEFT VENTRICLE PLAX 2D LVIDd:         3.50 cm   Diastology LVIDs:         1.80 cm   LV e' medial:    9.20 cm/s LV PW:         1.00 cm   LV E/e' medial:  15.4 LV IVS:        1.00 cm   LV e' lateral:   9.11 cm/s LVOT diam:     2.30 cm   LV E/e' lateral: 15.6 LV SV:         114 LV SV Index:   56 LVOT Area:     4.15 cm  RIGHT VENTRICLE RV S prime:     14.70 cm/s TAPSE (M-mode): 2.0 cm LEFT ATRIUM           Index        RIGHT ATRIUM           Index LA diam:      4.40 cm 2.16 cm/m   RA Area:     18.10 cm LA Vol (A2C):  46.5 ml 22.78 ml/m  RA Volume:   44.70 ml  21.90 ml/m LA Vol (A4C): 57.1 ml 27.98 ml/m  AORTIC VALVE AV Area (Vmax):    2.36 cm AV Area (Vmean):   2.26 cm AV Area (VTI):     2.51 cm AV Vmax:           223.00 cm/s AV Vmean:          149.000 cm/s AV VTI:            0.455 m AV Peak Grad:      19.9 mmHg AV Mean Grad:      10.0 mmHg LVOT Vmax:         126.50 cm/s LVOT Vmean:        81.050 cm/s LVOT VTI:          0.274 m LVOT/AV VTI ratio: 0.60  AORTA Ao Root diam: 3.70 cm Ao Asc diam:  4.10 cm MITRAL VALVE                TRICUSPID VALVE MV Area (PHT): 4.39 cm     TR Peak grad:   26.6 mmHg MV  Decel Time: 173 msec     TR Vmax:        258.00 cm/s MR Peak grad: 30.1 mmHg MR Mean grad: 23.0 mmHg     SHUNTS MR Vmax:      274.50 cm/s   Systemic VTI:  0.27 m MR Vmean:     233.0 cm/s    Systemic Diam: 2.30 cm MV E velocity: 142.00 cm/s MV A velocity: 50.70 cm/s MV E/A ratio:  2.80 Lennie OdorWesley O'Neal MD Electronically signed by Lennie OdorWesley O'Neal MD Signature Date/Time: 09/23/2022/4:44:56 PM    Final    DG CHEST PORT 1 VIEW  Result Date: 09/23/2022 CLINICAL DATA:  Pneumonia EXAM: PORTABLE CHEST 1 VIEW COMPARISON:  Radiograph 09/22/2022 FINDINGS: There are 2 left apical chest tubes and a left basilar chest tube unchanged in position. Unchanged residual pleural effusion and adjacent atelectasis. Unchanged small right pleural effusion. Unchanged small lucency in the left peripheral lung base consistent with pneumothorax, and with adjacent pleural thickening. Thoracic spondylosis. Bones are unchanged. IMPRESSION: Unchanged left pleural effusion with small basilar gas component and multiple chest tubes in place. Unchanged adjacent atelectasis. Unchanged small right pleural effusion and atelectasis. Electronically Signed   By: Caprice RenshawJacob  Kahn M.D.   On: 09/23/2022 08:23   DG CHEST PORT 1 VIEW  Result Date: 09/22/2022 CLINICAL DATA:  Chest tube placement. EXAM: PORTABLE CHEST 1 VIEW COMPARISON:  11/24/22. FINDINGS: Three pigtail chest  tubes have been inserted on the left, 1 projecting over the upper medial aspect of the hemithorax, another over the mid to upper lateral hemithorax and the third over the inferior, medial left hemithorax. There has been significant improvement with a notable decrease in opacity in the left mid and upper lung consistent with evacuation of loculated pleural effusions. Opacity at the left base has improved. Small left lateral lung base pneumothorax, likely ex vacuole. No apical pneumothorax. There is opacity at the right lung base that has developed since the previous day's study. This is likely a combination of a small effusion and atelectasis. Remainder of the right lung is clear. IMPRESSION: 1. Significant interval improvement in left lung aeration after placement of 3 left hemithorax chest tubes. 2. Small left inferior pneumothorax. 3. New opacity at the right lung base suspected to be a small effusion with atelectasis. Electronically Signed   By: Amie Portlandavid  Ormond M.D.   On: 09/22/2022 21:16   [facility]/IMG [facility]IDE  Result Date: 09/22/2022 INDICATION: 87 year old male with pneumonia and complex multiloculated left pleural effusion concerning for empyema. EXAM: CT-GUIDED CHEST TUBE PLACEMENT CT-GUIDED CHEST TUBE PLACEMENT CT-GUIDED CHEST TUBE PLACEMENT TECHNIQUE: Multidetector CT imaging of the CHEST was performed following the standard protocol WITHOUT IV contrast. RADIATION DOSE REDUCTION: This exam was performed according to the departmental dose-optimization program which includes automated exposure control, adjustment of the mA and/or kV according to patient size and/or use of iterative reconstruction technique. MEDICATIONS: The patient is currently admitted to the hospital and receiving intravenous antibiotics. The antibiotics were administered within an appropriate time frame prior to the initiation of the procedure. ANESTHESIA/SEDATION: Moderate (conscious) sedation was employed  during this procedure. A total of Versed 1 mg and Fentanyl 50 mcg was administered intravenously by the radiology nurse. Total intra-service moderate Sedation Time: 58 minutes. The patient's level of consciousness and vital signs were monitored continuously by radiology nursing throughout the procedure under my direct supervision. COMPLICATIONS: None immediate. PROCEDURE: Informed written consent was obtained from the patient after a thorough discussion of the  procedural risks, benefits and alternatives. All questions were addressed. Maximal Sterile Barrier Technique was utilized including caps, mask, sterile gowns, sterile gloves, sterile drape, hand hygiene and skin antiseptic. A timeout was performed prior to the initiation of the procedure. CHEST TUBE #1 A planning axial CT scan was performed. The largest fluid collection in the more inferior aspect of the lung was targeted. A suitable skin entry site was selected and marked. The skin was sterilely prepped and draped in the standard fashion using chlorhexidine skin prep. Local anesthesia was attained by infiltration with 1% lidocaine. A small dermatotomy was made. A 22 gauge spinal needle was advanced and imaging obtained to confirm an appropriate trajectory between the ribs and into the fluid collection. Once this was confirmed, a 4512 [place] all-purpose drainage catheter modified with additional sideholes was advanced over the rib and into the pleural space using trocar technique. The catheter was connected to low wall suction via a Sahara device. Aspiration yields at least 800 mL of opaque foul-smelling purulent fluid. A sample was sent aside for Gram stain and culture. The catheter was secured to the skin with 0 silk suture and sterile bandages were applied. Repeat CT imaging demonstrates no decrease in the other 2 loculated fluid collections consistent with the suspicion the day are indeed completely separate. Therefore, plans were made to proceed with  additional chest tube placement. CHEST TUBE #2 The posterior apical fluid collection was targeted next. A suitable skin entry site was selected and marked. The skin was sterilely prepped and draped in the standard fashion using chlorhexidine skin prep. Local anesthesia was attained by infiltration with 1% lidocaine. A small dermatotomy was made. A 22 gauge spinal needle was advanced and imaging obtained to confirm an appropriate trajectory between the ribs and into the fluid collection. Once this was confirmed, a 10 [place] all-purpose drainage catheter advanced over the rib and into the pleural space using trocar technique. The catheter was connected to low wall suction via a Sahara device. Aspiration yields 80 mL of opaque foul-smelling purulent bloody fluid. The catheter was secured to the skin with 0 silk suture and sterile bandages were applied. CHEST TUBE #3 The posterior apical fluid collection was targeted next. A suitable skin entry site was selected and marked. The skin was sterilely prepped and draped in the standard fashion using chlorhexidine skin prep. Local anesthesia was attained by infiltration with 1% lidocaine. A small dermatotomy was made. A 22 gauge spinal needle was advanced and imaging obtained to confirm an appropriate trajectory between the ribs and into the fluid collection. Once this was confirmed, a 10 [place] all-purpose drainage catheter advanced over the rib and into the pleural space using trocar technique. Approximately 800 mL of foul-smelling purulent fluid was then aspirated using a 60 cc syringe and an abscess evacuation bag. The drainage catheter was then connected to waterseal. The catheter was secured to the skin with 0 silk suture and sterile bandages were applied. IMPRESSION: 1. Placement of a total of 3 percutaneous pigtail chest tubes into the left pleural space (Tube #1, a 12 [place] via the left lower lateral chest into the most inferior and largest collection; tube #2, 10  [place] in the posterior apex, Tube #3, 61F in the left upper anterior collection). 2. Overall, at least 1,800 mL of foul-smelling purulent fluid was aspirated between the 3 drainage catheters. A sample was sent for Gram stain and culture. Electronically Signed   By: Malachy MoanHeath  McCullough M.D.   On: 09/22/2022 17:59  CT [facility] PLEURAL DRAIN W/INDWELL CATH W/IMG GUIDE  Result Date: 09/22/2022 INDICATION: 87 year old male with pneumonia and complex multiloculated left pleural effusion concerning for empyema. EXAM: CT-GUIDED CHEST TUBE PLACEMENT CT-GUIDED CHEST TUBE PLACEMENT CT-GUIDED CHEST TUBE PLACEMENT TECHNIQUE: Multidetector CT imaging of the CHEST was performed following the standard protocol WITHOUT IV contrast. RADIATION DOSE REDUCTION: This exam was performed according to the departmental dose-optimization program which includes automated exposure control, adjustment of the mA and/or kV according to patient size and/or use of iterative reconstruction technique. MEDICATIONS: The patient is currently admitted to the hospital and receiving intravenous antibiotics. The antibiotics were administered within an appropriate time frame prior to the initiation of the procedure. ANESTHESIA/SEDATION: Moderate (conscious) sedation was employed during this procedure. A total of Versed 1 mg and Fentanyl 50 mcg was administered intravenously by the radiology nurse. Total intra-service moderate Sedation Time: 58 minutes. The patient's level of consciousness and vital signs were monitored continuously by radiology nursing throughout the procedure under my direct supervision. COMPLICATIONS: None immediate. PROCEDURE: Informed written consent was obtained from the patient after a thorough discussion of the procedural risks, benefits and alternatives. All questions were addressed. Maximal Sterile Barrier Technique was utilized including caps, mask, sterile gowns, sterile gloves, sterile drape, hand hygiene and skin antiseptic. A  timeout was performed prior to the initiation of the procedure. CHEST TUBE #1 A planning axial CT scan was performed. The largest fluid collection in the more inferior aspect of the lung was targeted. A suitable skin entry site was selected and marked. The skin was sterilely prepped and draped in the standard fashion using chlorhexidine skin prep. Local anesthesia was attained by infiltration with 1% lidocaine. A small dermatotomy was made. A 22 gauge spinal needle was advanced and imaging obtained to confirm an appropriate trajectory between the ribs and into the fluid collection. Once this was confirmed, a 62 [place] all-purpose drainage catheter modified with additional sideholes was advanced over the rib and into the pleural space using trocar technique. The catheter was connected to low wall suction via a Sahara device. Aspiration yields at least 800 mL of opaque foul-smelling purulent fluid. A sample was sent aside for Gram stain and culture. The catheter was secured to the skin with 0 silk suture and sterile bandages were applied. Repeat CT imaging demonstrates no decrease in the other 2 loculated fluid collections consistent with the suspicion the day are indeed completely separate. Therefore, plans were made to proceed with additional chest tube placement. CHEST TUBE #2 The posterior apical fluid collection was targeted next. A suitable skin entry site was selected and marked. The skin was sterilely prepped and draped in the standard fashion using chlorhexidine skin prep. Local anesthesia was attained by infiltration with 1% lidocaine. A small dermatotomy was made. A 22 gauge spinal needle was advanced and imaging obtained to confirm an appropriate trajectory between the ribs and into the fluid collection. Once this was confirmed, a 10 [place] all-purpose drainage catheter advanced over the rib and into the pleural space using trocar technique. The catheter was connected to low wall suction via a Sahara  device. Aspiration yields 80 mL of opaque foul-smelling purulent bloody fluid. The catheter was secured to the skin with 0 silk suture and sterile bandages were applied. CHEST TUBE #3 The posterior apical fluid collection was targeted next. A suitable skin entry site was selected and marked. The skin was sterilely prepped and draped in the standard fashion using chlorhexidine skin prep. Local anesthesia was attained by infiltration  with 1% lidocaine. A small dermatotomy was made. A 22 gauge spinal needle was advanced and imaging obtained to confirm an appropriate trajectory between the ribs and into the fluid collection. Once this was confirmed, a 10 [place] all-purpose drainage catheter advanced over the rib and into the pleural space using trocar technique. Approximately 800 mL of foul-smelling purulent fluid was then aspirated using a 60 cc syringe and an abscess evacuation bag. The drainage catheter was then connected to waterseal. The catheter was secured to the skin with 0 silk suture and sterile bandages were applied. IMPRESSION: 1. Placement of a total of 3 percutaneous pigtail chest tubes into the left pleural space (Tube #1, a 12 [place] via the left lower lateral chest into the most inferior and largest collection; tube #2, 10 [place] in the posterior apex, Tube #3, 65F in the left upper anterior collection). 2. Overall, at least 1,800 mL of foul-smelling purulent fluid was aspirated between the 3 drainage catheters. A sample was sent for Gram stain and culture. Electronically Signed   By: Malachy Moan M.D.   On: 09/22/2022 17:59   CT [facility] PLEURAL DRAIN W/INDWELL CATH W/IMG GUIDE  Result Date: 09/22/2022 INDICATION: 87 year old male with pneumonia and complex multiloculated left pleural effusion concerning for empyema. EXAM: CT-GUIDED CHEST TUBE PLACEMENT CT-GUIDED CHEST TUBE PLACEMENT CT-GUIDED CHEST TUBE PLACEMENT TECHNIQUE: Multidetector CT imaging of the CHEST was performed following the standard  protocol WITHOUT IV contrast. RADIATION DOSE REDUCTION: This exam was performed according to the departmental dose-optimization program which includes automated exposure control, adjustment of the mA and/or kV according to patient size and/or use of iterative reconstruction technique. MEDICATIONS: The patient is currently admitted to the hospital and receiving intravenous antibiotics. The antibiotics were administered within an appropriate time frame prior to the initiation of the procedure. ANESTHESIA/SEDATION: Moderate (conscious) sedation was employed during this procedure. A total of Versed 1 mg and Fentanyl 50 mcg was administered intravenously by the radiology nurse. Total intra-service moderate Sedation Time: 58 minutes. The patient's level of consciousness and vital signs were monitored continuously by radiology nursing throughout the procedure under my direct supervision. COMPLICATIONS: None immediate. PROCEDURE: Informed written consent was obtained from the patient after a thorough discussion of the procedural risks, benefits and alternatives. All questions were addressed. Maximal Sterile Barrier Technique was utilized including caps, mask, sterile gowns, sterile gloves, sterile drape, hand hygiene and skin antiseptic. A timeout was performed prior to the initiation of the procedure. CHEST TUBE #1 A planning axial CT scan was performed. The largest fluid collection in the more inferior aspect of the lung was targeted. A suitable skin entry site was selected and marked. The skin was sterilely prepped and draped in the standard fashion using chlorhexidine skin prep. Local anesthesia was attained by infiltration with 1% lidocaine. A small dermatotomy was made. A 22 gauge spinal needle was advanced and imaging obtained to confirm an appropriate trajectory between the ribs and into the fluid collection. Once this was confirmed, a 34 [place] all-purpose drainage catheter modified with additional sideholes was  advanced over the rib and into the pleural space using trocar technique. The catheter was connected to low wall suction via a Sahara device. Aspiration yields at least 800 mL of opaque foul-smelling purulent fluid. A sample was sent aside for Gram stain and culture. The catheter was secured to the skin with 0 silk suture and sterile bandages were applied. Repeat CT imaging demonstrates no decrease in the other 2 loculated fluid collections consistent with the  suspicion the day are indeed completely separate. Therefore, plans were made to proceed with additional chest tube placement. CHEST TUBE #2 The posterior apical fluid collection was targeted next. A suitable skin entry site was selected and marked. The skin was sterilely prepped and draped in the standard fashion using chlorhexidine skin prep. Local anesthesia was attained by infiltration with 1% lidocaine. A small dermatotomy was made. A 22 gauge spinal needle was advanced and imaging obtained to confirm an appropriate trajectory between the ribs and into the fluid collection. Once this was confirmed, a 10 [place] all-purpose drainage catheter advanced over the rib and into the pleural space using trocar technique. The catheter was connected to low wall suction via a Sahara device. Aspiration yields 80 mL of opaque foul-smelling purulent bloody fluid. The catheter was secured to the skin with 0 silk suture and sterile bandages were applied. CHEST TUBE #3 The posterior apical fluid collection was targeted next. A suitable skin entry site was selected and marked. The skin was sterilely prepped and draped in the standard fashion using chlorhexidine skin prep. Local anesthesia was attained by infiltration with 1% lidocaine. A small dermatotomy was made. A 22 gauge spinal needle was advanced and imaging obtained to confirm an appropriate trajectory between the ribs and into the fluid collection. Once this was confirmed, a 10 [place] all-purpose drainage catheter  advanced over the rib and into the pleural space using trocar technique. Approximately 800 mL of foul-smelling purulent fluid was then aspirated using a 60 cc syringe and an abscess evacuation bag. The drainage catheter was then connected to waterseal. The catheter was secured to the skin with 0 silk suture and sterile bandages were applied. IMPRESSION: 1. Placement of a total of 3 percutaneous pigtail chest tubes into the left pleural space (Tube #1, a 12 [place] via the left lower lateral chest into the most inferior and largest collection; tube #2, 10 [place] in the posterior apex, Tube #3, 31F in the left upper anterior collection). 2. Overall, at least 1,800 mL of foul-smelling purulent fluid was aspirated between the 3 drainage catheters. A sample was sent for Gram stain and culture. Electronically Signed   By: Malachy Moan M.D.   On: 09/22/2022 17:59   CT CHEST WO CONTRAST  Result Date: 2022-09-24 CLINICAL DATA:  Pneumonia complication suspected. X-ray done showing pleural effusion. Malignancy suspected. Congestion and cough EXAM: CT CHEST WITHOUT CONTRAST TECHNIQUE: Multidetector CT imaging of the chest was performed following the standard protocol without IV contrast. RADIATION DOSE REDUCTION: This exam was performed according to the departmental dose-optimization program which includes automated exposure control, adjustment of the mA and/or kV according to patient size and/or use of iterative reconstruction technique. COMPARISON:  Radiographs earlier today FINDINGS: Cardiovascular: Normal heart size. Small pericardial effusion. Aortic valve and mitral annular calcification. Coronary artery and aortic atherosclerotic calcification. Mediastinum/Nodes: 1.9 cm left thyroid nodule. Unremarkable esophagus. No definite thoracic adenopathy on noncontrast exam. Lungs/Pleura: Layering secretions in the trachea extending into the left mainstem bronchus where there is near occlusion. Mucous plugging throughout  the left lingular and lower lobe bronchi. Mild diffuse bronchial wall thickening. Large loculated pleural effusion on the left. There is suggestion of wall thickening about the loculated effusions compatible with empyemas though this is incompletely evaluated without IV contrast. Near-complete collapse of both the left upper and lower lobes. Right posterior lower atelectasis/scarring and pleural thickening. Emphysema. Upper Abdomen: Poorly evaluated.  No definite acute abnormality. Musculoskeletal: No acute osseous abnormality. IMPRESSION: Large loculated left pleural  effusion. This is suspicious for empyemas though is incompletely evaluated without IV contrast. Malignant effusion is not excluded. Large amount of secretions within the trachea and left lung bronchi with postobstructive atelectasis/pneumonia. Coronary artery atherosclerosis. Aortic Atherosclerosis (ICD10-I70.0) and Emphysema (ICD10-J43.9). 1.9 cm left thyroid nodule. Nonemergent ultrasound could be performed if clinically indicated given comorbidities and life expectancy. Electronically Signed   By: Minerva Fester M.D.   On: 26-Sep-2022 20:36   DG Chest Port 1 View  Result Date: 09/26/2022 CLINICAL DATA:  New onset AFib EXAM: PORTABLE CHEST 1 VIEW COMPARISON:  None. FINDINGS: Cardiac silhouette appears enlarged. There is complete opacification of the left hemithorax. Right lung is clear. No pneumothorax. No acute fractures. IMPRESSION: Complete opacification of the left hemithorax, likely combination of pleural effusion and atelectasis/consolidation. Electronically Signed   By: Darliss Cheney M.D.   On: Sep 26, 2022 18:36    Microbiology: Recent Results (from the past 240 hour(s))  Expectorated Sputum Assessment w Gram Stain, Rflx to Resp Cult     Status: None   Collection Time: 09/26/2022 11:42 AM   Specimen: Sputum  Result Value Ref Range Status   Specimen Description SPUTUM  Final   Special Requests NONE  Final   Sputum evaluation   Final     THIS SPECIMEN IS ACCEPTABLE FOR SPUTUM CULTURE Performed at [facility], [address] [place], [place] [address]    Report Status 09/22/2022 FINAL  Final  Culture, blood (single)     Status: None   Collection Time: 09/26/2022  5:54 PM   Specimen: BLOOD  Result Value Ref Range Status   Specimen Description   Final    BLOOD BLOOD LEFT ARM Performed at [facility], [address] [place], [place] [address]    Special Requests   Final    BOTTLES DRAWN AEROBIC AND ANAEROBIC Blood Culture results may not be optimal due to an inadequate volume of blood received in culture bottles Performed at [facility], [address], [place], [place] [address]    Culture   Final    NO GROWTH 5 DAYS Performed at [facility], [address], [place], [place] [address]    Report Status 09/26/2022 FINAL  Final  Culture, Respiratory w Gram Stain     Status: None   Collection Time: 09/22/22 11:45 AM   Specimen: SPU  Result Value Ref Range Status   Specimen Description   Final    SPUTUM Performed at [facility], [address] [place], [place] [address]    Special Requests   Final    NONE Reflexed from [id number] Performed at [facility], [address] [place], [place] [address]    Gram Stain   Final    NO WBC SEEN FEW GRAM POSITIVE COCCI IN CHAINS FEW GRAM POSITIVE RODS    Culture   Final    RARE STENOTROPHOMONAS MALTOPHILIA NO STAPHYLOCOCCUS AUREUS ISOLATED Performed at [facility], [address] [place], [place] [address]    Report Status 09/26/2022 FINAL  Final   Organism ID, Bacteria STENOTROPHOMONAS MALTOPHILIA  Final      Susceptibility   Stenotrophomonas maltophilia - MIC*    LEVOFLOXACIN 1 SENSITIVE Sensitive     TRIMETH/SULFA <=20 SENSITIVE Sensitive     * RARE STENOTROPHOMONAS MALTOPHILIA  MRSA Next Gen by PCR, Nasal     Status: None   Collection Time: 09/22/22   2:42 PM   Specimen: Nasal Mucosa;  Nasal Swab  Result Value Ref Range Status   MRSA by PCR Next Gen NOT DETECTED NOT DETECTED Final    Comment: (NOTE) The GeneXpert MRSA Assay (FDA approved for NASAL specimens only), is one component of a comprehensive MRSA colonization surveillance program. It is not intended to diagnose MRSA infection nor to guide or monitor treatment for MRSA infections. Test performance is not FDA approved in patients less than 66 years old. Performed at [facility], [address], [place], [place] [address]   Body fluid culture w Gram Stain     Status: None   Collection Time: 09/22/22  5:05 PM   Specimen: Pleural Fluid  Result Value Ref Range Status   Specimen Description   Final    PLEURAL Performed at [facility], [address], [place], [place] [address]    Special Requests   Final    NONE Performed at [facility], [address] [place], [place] [address]    Gram Stain   Final    ABUNDANT WBC PRESENT, PREDOMINANTLY PMN ABUNDANT GRAM POSITIVE COCCI IN PAIRS IN CHAINS Gram Stain Report Called to,Read Back By and Verified With: RN Astrid Divine [phone] @[phone]  FH Performed at [facility], [address] [place], [place]    Culture MODERATE STREPTOCOCCUS INTERMEDIUS  Final   Report Status 09/26/2022 FINAL  Final   Organism ID, Bacteria STREPTOCOCCUS INTERMEDIUS  Final      Susceptibility   Streptococcus intermedius - MIC*    PENICILLIN <=0.06 SENSITIVE Sensitive     CEFTRIAXONE <=0.12 SENSITIVE Sensitive     ERYTHROMYCIN <=0.12 SENSITIVE Sensitive     LEVOFLOXACIN <=0.25 SENSITIVE Sensitive     VANCOMYCIN 0.5 SENSITIVE Sensitive     * MODERATE STREPTOCOCCUS INTERMEDIUS  Fungus Culture With Stain     Status: None (Preliminary result)   Collection Time: 09/22/22  5:05 PM   Specimen: Pleural Fluid  Result Value Ref Range Status   Fungus Stain Final report  Final    Comment:  (NOTE) Performed At: [facility] [address] [place], [place] [id number] MD Jolene Schimke    Fungus (Mycology) Culture PENDING  Incomplete   Fungal Source PLEURAL  Final    Comment: Performed at [facility], [address], [place], [place]  Aerobic/Anaerobic Culture w Gram Stain (surgical/deep wound)     Status: None   Collection Time: 09/22/22  5:05 PM   Specimen: Pleura; Abscess  Result Value Ref Range Status   Specimen Description   Final    PLEURAL Performed at [facility], [address], [place], [place]    Special Requests   Final    Normal Performed at [facility], [address], [place], [place]    Gram Stain   Final    ABUNDANT WBC PRESENT, PREDOMINANTLY PMN ABUNDANT GRAM POSITIVE COCCI IN CHAINS    Culture   Final    ABUNDANT STREPTOCOCCUS INTERMEDIUS NO ANAEROBES ISOLATED Performed at [facility], [address], [place], [place]    Report Status 09/27/2022 FINAL  Final   Organism ID, Bacteria STREPTOCOCCUS INTERMEDIUS  Final      Susceptibility   Streptococcus intermedius - MIC*    PENICILLIN <=0.06 SENSITIVE Sensitive     CEFTRIAXONE <=0.12 SENSITIVE Sensitive     ERYTHROMYCIN <=0.12 SENSITIVE Sensitive     LEVOFLOXACIN <=0.25 SENSITIVE Sensitive  VANCOMYCIN <=0.12 SENSITIVE Sensitive     * ABUNDANT STREPTOCOCCUS INTERMEDIUS  Fungus Culture Result     Status: None   Collection Time: 09/22/22  5:05 PM  Result Value Ref Range Status   Result 1 Comment  Final    Comment: (NOTE) KOH/Calcofluor preparation:  no fungus observed. Performed At: [facility] [address] [place], [place] [id number] Jolene Schimke MD YF:[id number]   Resp panel by RT-PCR (RSV, Flu A&B, Covid) Anterior Nasal Swab     Status: None   Collection Time: 09/22/22 11:10 PM   Specimen: Anterior Nasal Swab  Result Value Ref Range Status    SARS Coronavirus 2 by RT PCR NEGATIVE NEGATIVE Final    Comment: (NOTE) SARS-CoV-2 target nucleic acids are NOT DETECTED.  The SARS-CoV-2 RNA is generally detectable in upper respiratory specimens during the acute phase of infection. The lowest concentration of SARS-CoV-2 viral copies this assay can detect is 138 copies/mL. A negative result does not preclude SARS-Cov-2 infection and should not be used as the sole basis for treatment or other patient management decisions. A negative result may occur with  improper specimen collection/handling, submission of specimen other than nasopharyngeal swab, presence of viral mutation(s) within the areas targeted by this assay, and inadequate number of viral copies(<138 copies/mL). A negative result must be combined with clinical observations, patient history, and epidemiological information. The expected result is Negative.  Fact Sheet for Patients:  BloggerCourse.com  Fact Sheet for Healthcare Providers:  SeriousBroker.it  This test is no t yet approved or cleared by the [place] FDA and  has been authorized for detection and/or diagnosis of SARS-CoV-2 by FDA under an Emergency Use Authorization (EUA). This EUA will remain  in effect (meaning this test can be used) for the duration of the COVID-19 declaration under Section 564(b)(1) of the Act, 21 U.S.C.section 360bbb-3(b)(1), unless the authorization is terminated  or revoked sooner.       Influenza A by PCR NEGATIVE NEGATIVE Final   Influenza B by PCR NEGATIVE NEGATIVE Final    Comment: (NOTE) The Xpert Xpress SARS-CoV-2/FLU/RSV plus assay is intended as an aid in the diagnosis of influenza from Nasopharyngeal swab specimens and should not be used as a sole basis for treatment. Nasal washings and aspirates are unacceptable for Xpert Xpress SARS-CoV-2/FLU/RSV testing.  Fact Sheet for  Patients: BloggerCourse.com  Fact Sheet for Healthcare Providers: SeriousBroker.it  This test is not yet approved or cleared by the [place] FDA and has been authorized for detection and/or diagnosis of SARS-CoV-2 by FDA under an Emergency Use Authorization (EUA). This EUA will remain in effect (meaning this test can be used) for the duration of the COVID-19 declaration under Section 564(b)(1) of the Act, 21 U.S.C. section 360bbb-3(b)(1), unless the authorization is terminated or revoked.     Resp Syncytial Virus by PCR NEGATIVE NEGATIVE Final    Comment: (NOTE) Fact Sheet for Patients: BloggerCourse.com  Fact Sheet for Healthcare Providers: SeriousBroker.it  This test is not yet approved or cleared by the [place] FDA and has been authorized for detection and/or diagnosis of SARS-CoV-2 by FDA under an Emergency Use Authorization (EUA). This EUA will remain in effect (meaning this test can be used) for the duration of the COVID-19 declaration under Section 564(b)(1) of the Act, 21 U.S.C. section 360bbb-3(b)(1), unless the authorization is terminated or revoked.  Performed at [facility], [address] [place], [place] [address]     Time spent: *** minutes  Signed: Valisa Karpel  Karinne Schmader, MD {dischdate:[id number]}

## 2022-10-16 DEATH — deceased

## 2022-10-21 LAB — FUNGAL ORGANISM REFLEX

## 2022-10-21 LAB — FUNGUS CULTURE WITH STAIN

## 2022-10-21 LAB — FUNGUS CULTURE RESULT
